# Patient Record
Sex: Male | Born: 1985 | Race: White | Hispanic: No | Marital: Single | Smoking: Never smoker
Health system: Southern US, Community
[De-identification: ages and names within clinical notes are randomized; demographics above are authoritative.]

## PROBLEM LIST (undated history)

## (undated) DIAGNOSIS — J302 Other seasonal allergic rhinitis: Secondary | ICD-10-CM

## (undated) DIAGNOSIS — F909 Attention-deficit hyperactivity disorder, unspecified type: Secondary | ICD-10-CM

## (undated) HISTORY — DX: Attention-deficit hyperactivity disorder, unspecified type: F90.9

## (undated) HISTORY — DX: Other seasonal allergic rhinitis: J30.2

---
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
## 2006-04-17 ENCOUNTER — Emergency Department (HOSPITAL_COMMUNITY): Admission: EM | Admit: 2006-04-17 | Discharge: 2006-04-17 | Payer: Self-pay | Admitting: Family Medicine

## 2013-11-06 ENCOUNTER — Telehealth: Payer: Self-pay

## 2013-11-06 NOTE — Telephone Encounter (Signed)
error 

## 2017-06-29 ENCOUNTER — Ambulatory Visit (INDEPENDENT_AMBULATORY_CARE_PROVIDER_SITE_OTHER): Payer: BLUE CROSS/BLUE SHIELD | Admitting: Adult Health

## 2017-06-29 ENCOUNTER — Encounter: Payer: Self-pay | Admitting: Adult Health

## 2017-06-29 VITALS — BP 118/72 | Temp 98.2°F | Wt 256.0 lb

## 2017-06-29 DIAGNOSIS — Z760 Encounter for issue of repeat prescription: Secondary | ICD-10-CM

## 2017-06-29 DIAGNOSIS — J0140 Acute pansinusitis, unspecified: Secondary | ICD-10-CM | POA: Diagnosis not present

## 2017-06-29 MED ORDER — LEVOCETIRIZINE DIHYDROCHLORIDE 5 MG PO TABS: 5.0000 mg | ORAL_TABLET | Freq: Every evening | ORAL | 1 refills | Status: DC

## 2017-06-29 MED ORDER — DOXYCYCLINE HYCLATE 100 MG PO CAPS: 100.0000 mg | ORAL_CAPSULE | Freq: Two times a day (BID) | ORAL | 0 refills | Status: DC

## 2017-06-29 NOTE — Progress Notes (Signed)
Subjective:    Patient ID: Walter Haney, male    DOB: 1985-03-13, 32 y.o.   MRN: [id number]  Sinusitis  This is a new problem. The current episode started 1 to 4 weeks ago. The problem is unchanged. There has been no fever. Associated symptoms include congestion, coughing (productive ), ear pain, headaches, sinus pressure and a sore throat. Pertinent negatives include no chills, shortness of breath or swollen glands. Treatments tried: Mucinex     Review of Systems  Constitutional: Positive for activity change and fatigue. Negative for appetite change, chills and fever.  HENT: Positive for congestion, ear pain, postnasal drip, rhinorrhea, sinus pressure, sinus pain and sore throat.   Respiratory: Positive for cough (productive ). Negative for chest tightness, shortness of breath and wheezing.   Cardiovascular: Negative.   Gastrointestinal: Negative.   Musculoskeletal: Negative.   Neurological: Positive for headaches.   History reviewed. No pertinent past medical history.  Social History   Socioeconomic History  . Marital status: Single    Spouse name: Not on file  . Number of children: Not on file  . Years of education: Not on file  . Highest education level: Not on file  Occupational History  . Not on file  Social Needs  . Financial resource strain: Not on file  . Food insecurity:    Worry: Not on file    Inability: Not on file  . Transportation needs:    Medical: Not on file    Non-medical: Not on file  Tobacco Use  . Smoking status: Never Smoker  . Smokeless tobacco: Current User    Types: Chew  Substance and Sexual Activity  . Alcohol use: Yes    Alcohol/week: 1.8 oz    Types: 3 Cans of beer per week  . Drug use: Not on file  . Sexual activity: Not on file  Lifestyle  . Physical activity:    Days per week: Not on file    Minutes per session: Not on file  . Stress: Not on file  Relationships  . Social connections:    Talks on phone: Not on file    Gets  together: Not on file    Attends religious service: Not on file    Active member of club or organization: Not on file    Attends meetings of clubs or organizations: Not on file    Relationship status: Not on file  . Intimate partner violence:    Fear of current or ex partner: Not on file    Emotionally abused: Not on file    Physically abused: Not on file    Forced sexual activity: Not on file  Other Topics Concern  . Not on file  Social History Narrative  . Not on file    History reviewed. No pertinent surgical history.  History reviewed. No pertinent family history.  No Known Allergies  Current Outpatient Medications on File Prior to Visit  Medication Sig Dispense Refill  . levocetirizine (XYZAL) 5 MG tablet Take 5 mg by mouth every evening.     No current facility-administered medications on file prior to visit.     BP 118/72 (BP Location: Left Arm)   Temp 98.2 F (36.8 C) (Oral)   Wt 256 lb (116.1 kg)       Objective:   Physical Exam  Constitutional: He appears well-developed and well-nourished. No distress.  HENT:  Head: Normocephalic and atraumatic.  Right Ear: Hearing, external ear and ear canal normal. Tympanic membrane  is bulging. Tympanic membrane is not erythematous.  Left Ear: Hearing, external ear and ear canal normal. Tympanic membrane is bulging. Tympanic membrane is not erythematous.  Nose: Mucosal edema and rhinorrhea present. Right sinus exhibits frontal sinus tenderness. Left sinus exhibits frontal sinus tenderness.  Mouth/Throat: Uvula is midline and mucous membranes are normal. Oropharyngeal exudate and posterior oropharyngeal erythema present.  Eyes: Pupils are equal, round, and reactive to light. Conjunctivae and EOM are normal. Right eye exhibits no discharge. Left eye exhibits no discharge. No scleral icterus.  Cardiovascular: Normal rate, regular rhythm, normal heart sounds and intact distal pulses. Exam reveals no friction rub.  No murmur  heard. Pulmonary/Chest: Effort normal and breath sounds normal. No respiratory distress. He has no wheezes. He has no rales. He exhibits no tenderness.  Skin: Skin is warm and dry. He is not diaphoretic.  Psychiatric: He has a normal mood and affect. His behavior is normal. Judgment and thought content normal.  Nursing note and vitals reviewed.     Assessment & Plan:  1. Acute non-recurrent pansinusitis  - doxycycline (VIBRAMYCIN) 100 MG capsule; Take 1 capsule (100 mg total) by mouth 2 (two) times daily.  Dispense: 14 capsule; Refill: 0 - Follow up as needed   2. Medication refill  - levocetirizine (XYZAL) 5 MG tablet; Take 1 tablet (5 mg total) by mouth every evening.  Dispense: 90 tablet; Refill: 1   Shirline Freesory Leilah Polimeni, NP

## 2017-07-06 ENCOUNTER — Telehealth: Payer: Self-pay | Admitting: Adult Health

## 2017-07-06 NOTE — Telephone Encounter (Signed)
Spoke to the pt.  He did pick up doxy that was sent to the pharmacy on 06/29/17.  One more dosage to take.  Does not seem to be helping much.  See below message and advise.  Pt notified Kandee Keen is not back in the office until 07/07/17.  Instructed to continue current treatment course until he hears from the office.

## 2017-07-06 NOTE — Telephone Encounter (Signed)
Copied from CRM [id number]. Topic: Inquiry >> Jul 06, 2017  3:29 PM Maia Petties wrote: Reason for CRM: pt still has same symptoms as last week but his throat feels even worse. His ears are stopped up. His head is pounding. He is having a lot of drainage. Pt states some fever/chills still as well. Pt requesting a medication be sent in. Please advise.  [id number] [place], [place] - [phone] (Phone) [phone] (Fax)

## 2017-07-07 ENCOUNTER — Ambulatory Visit: Payer: BLUE CROSS/BLUE SHIELD | Admitting: Adult Health

## 2017-07-07 NOTE — Telephone Encounter (Signed)
Left a message for a return call.

## 2017-07-07 NOTE — Telephone Encounter (Signed)
Pt scheduled for 07/07/17 to see [facility].  No further action required.

## 2017-07-07 NOTE — Telephone Encounter (Signed)
If his symptoms are getting worse he needs to be reevaluated, especially if he has a worsening sore throat. Needs to probably be swabbed for strep

## 2017-07-07 NOTE — Telephone Encounter (Signed)
Pt called back and advised per Kandee Keen needs to be re evaluated.  Pt verbalized understanding and made appt for today at 4 pm.

## 2017-07-08 ENCOUNTER — Encounter: Payer: Self-pay | Admitting: Adult Health

## 2017-07-08 ENCOUNTER — Ambulatory Visit (INDEPENDENT_AMBULATORY_CARE_PROVIDER_SITE_OTHER): Payer: BLUE CROSS/BLUE SHIELD | Admitting: Adult Health

## 2017-07-08 VITALS — BP 120/78 | HR 66 | Temp 98.3°F | Wt 252.0 lb

## 2017-07-08 DIAGNOSIS — J0140 Acute pansinusitis, unspecified: Secondary | ICD-10-CM

## 2017-07-08 MED ORDER — FLUTICASONE PROPIONATE 50 MCG/ACT NA SUSP: 2.0000 | Freq: Every day | NASAL | 6 refills | Status: DC

## 2017-07-08 MED ORDER — LEVOFLOXACIN 500 MG PO TABS: 500.0000 mg | ORAL_TABLET | Freq: Every day | ORAL | 0 refills | Status: DC

## 2017-07-08 NOTE — Progress Notes (Signed)
Subjective:    Patient ID: Walter Haney, male    DOB: 24-May-1985, 32 y.o.   MRN: [id number]  HPI 32 year old male who presents to the office today for follow up regarding sinusitis.  He was seen approximately 10 days ago for sinusitis-like symptoms.  He was treated with doxycycline 100 mg twice daily for 7 days.  Reports that some of his symptoms such as congestion and backed up cough have resolved.  Unfortunately, sinus pain and pressure, right ear pain, and sore throat have continued and have become slightly worse.  He also reports subjective fever, chills, diaphoresis.  So endorsing fatigue.  Denies any nausea, vomiting, diarrhea   Review of Systems See HPI   History reviewed. No pertinent past medical history.  Social History   Socioeconomic History  . Marital status: Single    Spouse name: Not on file  . Number of children: Not on file  . Years of education: Not on file  . Highest education level: Not on file  Occupational History  . Not on file  Social Needs  . Financial resource strain: Not on file  . Food insecurity:    Worry: Not on file    Inability: Not on file  . Transportation needs:    Medical: Not on file    Non-medical: Not on file  Tobacco Use  . Smoking status: Never Smoker  . Smokeless tobacco: Current User    Types: Chew  Substance and Sexual Activity  . Alcohol use: Yes    Alcohol/week: 1.8 oz    Types: 3 Cans of beer per week  . Drug use: Not on file  . Sexual activity: Not on file  Lifestyle  . Physical activity:    Days per week: Not on file    Minutes per session: Not on file  . Stress: Not on file  Relationships  . Social connections:    Talks on phone: Not on file    Gets together: Not on file    Attends religious service: Not on file    Active member of club or organization: Not on file    Attends meetings of clubs or organizations: Not on file    Relationship status: Not on file  . Intimate partner violence:    Fear of  current or ex partner: Not on file    Emotionally abused: Not on file    Physically abused: Not on file    Forced sexual activity: Not on file  Other Topics Concern  . Not on file  Social History Narrative  . Not on file    History reviewed. No pertinent surgical history.  History reviewed. No pertinent family history.  No Known Allergies  Current Outpatient Medications on File Prior to Visit  Medication Sig Dispense Refill  . levocetirizine (XYZAL) 5 MG tablet Take 5 mg by mouth every evening.    Marland Kitchen levocetirizine (XYZAL) 5 MG tablet Take 1 tablet (5 mg total) by mouth every evening. 90 tablet 1   No current facility-administered medications on file prior to visit.     BP 120/78 (BP Location: Left Arm, Patient Position: Sitting, Cuff Size: Large)   Pulse 66   Temp 98.3 F (36.8 C) (Oral)   Wt 252 lb (114.3 kg)   SpO2 98%       Objective:   Physical Exam  Constitutional: He is oriented to person, place, and time. He appears well-developed and well-nourished. No distress.  HENT:  Right Ear:  Tympanic membrane is bulging. Tympanic membrane is not erythematous.  Left Ear: Tympanic membrane is bulging. Tympanic membrane is not erythematous.  Nose: Mucosal edema present. No rhinorrhea. Right sinus exhibits maxillary sinus tenderness and frontal sinus tenderness. Left sinus exhibits maxillary sinus tenderness and frontal sinus tenderness.  Mouth/Throat: Uvula is midline and mucous membranes are normal. Oropharyngeal exudate and posterior oropharyngeal erythema (trace) present. No posterior oropharyngeal edema or tonsillar abscesses. Tonsils are 0 on the right. Tonsils are 0 on the left. No tonsillar exudate.  Neck: No thyromegaly present.  Cardiovascular: Normal rate, regular rhythm, normal heart sounds and intact distal pulses. Exam reveals no friction rub.  No murmur heard. Pulmonary/Chest: Effort normal and breath sounds normal. No stridor. No respiratory distress. He has no  wheezes. He has no rales. He exhibits no tenderness.  Lymphadenopathy:       Head (right side): Submandibular and tonsillar adenopathy present. No submental, no preauricular, no posterior auricular and no occipital adenopathy present.       Head (left side): Submandibular and tonsillar adenopathy present. No submental, no preauricular, no posterior auricular and no occipital adenopathy present.    He has no cervical adenopathy.  Neurological: He is alert and oriented to person, place, and time.  Skin: Skin is warm and dry. Capillary refill takes less than 2 seconds. No rash noted. He is not diaphoretic. No erythema. No pallor.  Psychiatric: He has a normal mood and affect. His behavior is normal. Judgment and thought content normal.  Nursing note and vitals reviewed.     Assessment & Plan:  1. Acute pansinusitis, recurrence not specified -We will treat as antibiotic failure.  Prescribe Levaquin 500 mg daily for 7 days.  Can also use Flonase.  Advised Motrin or Tylenol for symptom relief.  Stay hydrated and rest - levofloxacin (LEVAQUIN) 500 MG tablet; Take 1 tablet (500 mg total) by mouth daily.  Dispense: 7 tablet; Refill: 0 - fluticasone (FLONASE) 50 MCG/ACT nasal spray; Place 2 sprays into both nostrils daily.  Dispense: 16 g; Refill: 6 -Follow-up if no improvement after antibiotic course  Shirline Frees, NP

## 2017-08-09 ENCOUNTER — Encounter: Payer: Self-pay | Admitting: Adult Health

## 2017-08-09 ENCOUNTER — Ambulatory Visit (INDEPENDENT_AMBULATORY_CARE_PROVIDER_SITE_OTHER): Payer: BLUE CROSS/BLUE SHIELD | Admitting: Adult Health

## 2017-08-09 VITALS — BP 98/70 | Temp 97.7°F | Wt 253.0 lb

## 2017-08-09 DIAGNOSIS — Z720 Tobacco use: Secondary | ICD-10-CM | POA: Diagnosis not present

## 2017-08-09 DIAGNOSIS — Z0000 Encounter for general adult medical examination without abnormal findings: Secondary | ICD-10-CM | POA: Diagnosis not present

## 2017-08-09 LAB — CBC WITH DIFFERENTIAL/PLATELET
Basophils Absolute: 0 10*3/uL (ref 0.0–0.1)
Basophils Relative: 0.7 % (ref 0.0–3.0)
Eosinophils Absolute: 0.1 10*3/uL (ref 0.0–0.7)
Eosinophils Relative: 1.1 % (ref 0.0–5.0)
HCT: 44 % (ref 39.0–52.0)
Hemoglobin: 15.3 g/dL (ref 13.0–17.0)
Lymphocytes Relative: 30.5 % (ref 12.0–46.0)
Lymphs Abs: 1.4 10*3/uL (ref 0.7–4.0)
MCHC: 34.9 g/dL (ref 30.0–36.0)
MCV: 95.8 fl (ref 78.0–100.0)
Monocytes Absolute: 0.4 10*3/uL (ref 0.1–1.0)
Monocytes Relative: 8.1 % (ref 3.0–12.0)
Neutro Abs: 2.8 10*3/uL (ref 1.4–7.7)
Neutrophils Relative %: 59.6 % (ref 43.0–77.0)
Platelets: 233 10*3/uL (ref 150.0–400.0)
RBC: 4.59 Mil/uL (ref 4.22–5.81)
RDW: 12.7 % (ref 11.5–15.5)
WBC: 4.7 10*3/uL (ref 4.0–10.5)

## 2017-08-09 LAB — LIPID PANEL
Cholesterol: 176 mg/dL (ref 0–200)
HDL: 44.6 mg/dL (ref >39.00)
NonHDL: 131.84
Total CHOL/HDL Ratio: 4
Triglycerides: 206 mg/dL — ABNORMAL HIGH (ref 0.0–149.0)
VLDL: 41.2 mg/dL — ABNORMAL HIGH (ref 0.0–40.0)

## 2017-08-09 LAB — HEMOGLOBIN A1C: Hgb A1c MFr Bld: 5.3 % (ref 4.6–6.5)

## 2017-08-09 LAB — BASIC METABOLIC PANEL
BUN: 11 mg/dL (ref 6–23)
CO2: 28 mEq/L (ref 19–32)
Calcium: 9.2 mg/dL (ref 8.4–10.5)
Chloride: 104 mEq/L (ref 96–112)
Creatinine, Ser: 0.91 mg/dL (ref 0.40–1.50)
GFR: 102.79 mL/min (ref >60.00)
Glucose, Bld: 97 mg/dL (ref 70–99)
Potassium: 4.3 mEq/L (ref 3.5–5.1)
Sodium: 140 mEq/L (ref 135–145)

## 2017-08-09 LAB — HEPATIC FUNCTION PANEL
ALT: 24 U/L (ref 0–53)
AST: 16 U/L (ref 0–37)
Albumin: 4.5 g/dL (ref 3.5–5.2)
Alkaline Phosphatase: 47 U/L (ref 39–117)
Bilirubin, Direct: 0.1 mg/dL (ref 0.0–0.3)
Total Bilirubin: 0.4 mg/dL (ref 0.2–1.2)
Total Protein: 6.7 g/dL (ref 6.0–8.3)

## 2017-08-09 LAB — TSH: TSH: 2.12 u[IU]/mL (ref 0.35–4.50)

## 2017-08-09 LAB — LDL CHOLESTEROL, DIRECT: Direct LDL: 102 mg/dL

## 2017-08-09 NOTE — Progress Notes (Signed)
Patient presents to clinic today to establish care. He is a pleasant 32 year old male who  has a past medical history of Seasonal allergies.   Acute Concerns: Establish Care/CPE   Chronic Issues: Seasonal Allergies - controlled with Flonase and Zyzal   Smokeless tobacco use - Has been an issue for sometime now. He has quit multiple times in the past. Currently 1/2 tin in 24 hours. Has been working on quitting. Has tried coffee ground patches and reports good success with this.   Health Maintenance: Dental -- Routine - [facility]  Vision -- Routine - [facility] Immunizations -- UTD - Tetanus 3 years ago.  Diet: Tries to eat healthy. Diet will fluctuate. He travels a lot for work.  Exercise: works out 3-4 times a week. He finds it difficult to loose weight.   Past Medical History:  Diagnosis Date  . Seasonal allergies     No past surgical history on file.  Current Outpatient Medications on File Prior to Visit  Medication Sig Dispense Refill  . fluticasone (FLONASE) 50 MCG/ACT nasal spray Place 2 sprays into both nostrils daily. 16 g 6  . levocetirizine (XYZAL) 5 MG tablet Take 5 mg by mouth every evening.     No current facility-administered medications on file prior to visit.     No Known Allergies  Family History  Problem Relation Age of Onset  . High blood pressure Father   . High Cholesterol Father   . Benign prostatic hyperplasia Father   . Glaucoma Father     Social History   Socioeconomic History  . Marital status: Single    Spouse name: Not on file  . Number of children: Not on file  . Years of education: Not on file  . Highest education level: Not on file  Occupational History  . Not on file  Social Needs  . Financial resource strain: Not on file  . Food insecurity:    Worry: Not on file    Inability: Not on file  . Transportation needs:    Medical: Not on file    Non-medical: Not on file  Tobacco Use  . Smoking status: Never Smoker  .  Smokeless tobacco: Current User    Types: Chew  Substance and Sexual Activity  . Alcohol use: Yes    Alcohol/week: 1.8 oz    Types: 3 Cans of beer per week  . Drug use: Not on file  . Sexual activity: Yes    Partners: Female  Lifestyle  . Physical activity:    Days per week: Not on file    Minutes per session: Not on file  . Stress: Not on file  Relationships  . Social connections:    Talks on phone: Not on file    Gets together: Not on file    Attends religious service: Not on file    Active member of club or organization: Not on file    Attends meetings of clubs or organizations: Not on file    Relationship status: Not on file  . Intimate partner violence:    Fear of current or ex partner: Not on file    Emotionally abused: Not on file    Physically abused: Not on file    Forced sexual activity: Not on file  Other Topics Concern  . Not on file  Social History Narrative  . Not on file    Review of Systems  Constitutional: Negative.   Eyes: Negative.  Respiratory: Negative.   Cardiovascular: Negative.   Gastrointestinal: Negative.   Genitourinary: Negative.   Musculoskeletal: Negative.   Skin: Negative.   Neurological: Negative.   Endo/Heme/Allergies: Negative.   Psychiatric/Behavioral: Negative.   All other systems reviewed and are negative.    There were no vitals taken for this visit.  Physical Exam  Constitutional: He is oriented to person, place, and time. He appears well-developed and well-nourished. No distress.  HENT:  Head: Normocephalic and atraumatic.  Right Ear: External ear normal.  Left Ear: External ear normal.  Nose: Nose normal.  Mouth/Throat: Oropharynx is clear and moist. No oropharyngeal exudate.  Eyes: Pupils are equal, round, and reactive to light. Conjunctivae and EOM are normal. Right eye exhibits no discharge. Left eye exhibits no discharge. No scleral icterus.  Neck: Normal range of motion. Neck supple. No JVD present. No tracheal  deviation present. No thyromegaly present.  Cardiovascular: Normal rate, regular rhythm, normal heart sounds and intact distal pulses. Exam reveals no gallop and no friction rub.  No murmur heard. Pulmonary/Chest: Effort normal and breath sounds normal. No stridor. No respiratory distress. He has no wheezes. He has no rales. He exhibits no tenderness.  Abdominal: Soft. Bowel sounds are normal. He exhibits no distension and no mass. There is no tenderness. There is no rebound and no guarding. No hernia.  Musculoskeletal: Normal range of motion. He exhibits no edema, tenderness or deformity.  Lymphadenopathy:    He has no cervical adenopathy.  Neurological: He is alert and oriented to person, place, and time. He displays normal reflexes. No cranial nerve deficit or sensory deficit. He exhibits normal muscle tone. Coordination normal.  Skin: Skin is warm and dry. Capillary refill takes less than 2 seconds. No rash noted. He is not diaphoretic. No erythema. No pallor.  Psychiatric: He has a normal mood and affect. His behavior is normal. Judgment and thought content normal.  Nursing note and vitals reviewed.  Assessment/Plan: 1. Routine general medical examination at a health care facility - Follow up in one year or sooner if needed - Continue to work on diet and exercise  - Basic metabolic panel - CBC with Differential/Platelet - Hemoglobin A1c - Hepatic function panel - Lipid panel - TSH  2. Tobacco use - Encouraged to quit.  - Can go back to non tobacco pouches  Shirline Frees, NP

## 2017-08-09 NOTE — Patient Instructions (Signed)
I will follow up with you regarding your blood work   Please let me know if you need anything   Please quit dipping.

## 2017-09-30 ENCOUNTER — Telehealth: Payer: Self-pay | Admitting: Adult Health

## 2017-09-30 NOTE — Telephone Encounter (Unsigned)
Copied from CRM [id number]. Topic: Quick Communication - See Telephone Encounter >> Sep 30, 2017 12:45 PM Raquel SarnaHayes, Teresa G wrote: Pt has been off of Adderral for 4 months, the time since he as moved to this area. He is wanting to get back on it to help since is trouble focusing and forgetting things. Please call pt to discuss.  Pt is willing to come in for a visit if needed.

## 2017-10-04 NOTE — Telephone Encounter (Signed)
Would you like to see the pt or do a referral to specialist?

## 2017-10-04 NOTE — Telephone Encounter (Signed)
Since it is a controlled substance I will need an office visit to discuss. I have no problems taking this over for him

## 2017-10-04 NOTE — Telephone Encounter (Signed)
Patient will check his schedule and call back to set up an appointment

## 2017-10-04 NOTE — Telephone Encounter (Addendum)
Left a message for a return call.  CRM created. 

## 2017-10-07 NOTE — Telephone Encounter (Signed)
°  Relation to pt: self  Call back number: [phone]   Reason for call:  [facility] call [facility]

## 2017-10-11 ENCOUNTER — Ambulatory Visit (INDEPENDENT_AMBULATORY_CARE_PROVIDER_SITE_OTHER): Payer: BLUE CROSS/BLUE SHIELD | Admitting: Adult Health

## 2017-10-11 ENCOUNTER — Encounter: Payer: Self-pay | Admitting: Adult Health

## 2017-10-11 VITALS — BP 114/72 | Temp 98.1°F | Wt 251.0 lb

## 2017-10-11 DIAGNOSIS — F900 Attention-deficit hyperactivity disorder, predominantly inattentive type: Secondary | ICD-10-CM | POA: Diagnosis not present

## 2017-10-11 MED ORDER — AMPHETAMINE-DEXTROAMPHETAMINE 20 MG PO TABS: 20.0000 mg | ORAL_TABLET | Freq: Two times a day (BID) | ORAL | 0 refills | Status: DC

## 2017-10-11 NOTE — Progress Notes (Signed)
Subjective:    Patient ID: Walter Haney, male    DOB: April 18, 1985, 32 y.o.   MRN: [id number]  HPI 32 year old male who  has a past medical history of Seasonal allergies. He presents to the office to discuss Adderall management. His previous PCP was prescribing his medication, he has been without it for four months. He reports that he has trouble focusing and is having trouble forgetting things. He feels as though his symptoms are causing him issues at work and at home.     Review of Systems See HPI   Past Medical History:  Diagnosis Date  . Seasonal allergies     Social History   Socioeconomic History  . Marital status: Single    Spouse name: Not on file  . Number of children: Not on file  . Years of education: Not on file  . Highest education level: Not on file  Occupational History  . Not on file  Social Needs  . Financial resource strain: Not on file  . Food insecurity:    Worry: Not on file    Inability: Not on file  . Transportation needs:    Medical: Not on file    Non-medical: Not on file  Tobacco Use  . Smoking status: Never Smoker  . Smokeless tobacco: Current User    Types: Chew  Substance and Sexual Activity  . Alcohol use: Yes    Alcohol/week: 1.8 oz    Types: 3 Cans of beer per week  . Drug use: Not on file  . Sexual activity: Yes    Partners: Female  Lifestyle  . Physical activity:    Days per week: Not on file    Minutes per session: Not on file  . Stress: Not on file  Relationships  . Social connections:    Talks on phone: Not on file    Gets together: Not on file    Attends religious service: Not on file    Active member of club or organization: Not on file    Attends meetings of clubs or organizations: Not on file    Relationship status: Not on file  . Intimate partner violence:    Fear of current or ex partner: Not on file    Emotionally abused: Not on file    Physically abused: Not on file    Forced sexual activity: Not on file   Other Topics Concern  . Not on file  Social History Narrative  . Not on file    History reviewed. No pertinent surgical history.  Family History  Problem Relation Age of Onset  . High blood pressure Father   . High Cholesterol Father   . Benign prostatic hyperplasia Father   . Glaucoma Father     No Known Allergies  Current Outpatient Medications on File Prior to Visit  Medication Sig Dispense Refill  . cetirizine (ZYRTEC) 10 MG tablet Take 10 mg by mouth daily.    . fluticasone (FLONASE) 50 MCG/ACT nasal spray Place 2 sprays into both nostrils daily. 16 g 6   No current facility-administered medications on file prior to visit.     BP 114/72   Temp 98.1 F (36.7 C) (Oral)   Wt 251 lb (113.9 kg)       Objective:   Physical Exam  Constitutional: He is oriented to person, place, and time. He appears well-developed and well-nourished. No distress.  Cardiovascular: Normal rate and regular rhythm.  Pulmonary/Chest: Effort normal and  breath sounds normal.  Musculoskeletal: Normal range of motion.  Neurological: He is alert and oriented to person, place, and time.  Skin: Skin is warm and dry. He is not diaphoretic.  Psychiatric: He has a normal mood and affect. His behavior is normal. Judgment and thought content normal.  Vitals reviewed.     Assessment & Plan:  1. Attention deficit hyperactivity disorder (ADHD), predominantly inattentive type - I am ok with restarting/ prescribing him Adderall. Advised follow up in 6 months or sooner if needed - amphetamine-dextroamphetamine (ADDERALL) 20 MG tablet; Take 1 tablet (20 mg total) by mouth 2 (two) times daily.  Dispense: 60 tablet; Refill: 0 - amphetamine-dextroamphetamine (ADDERALL) 20 MG tablet; Take 1 tablet (20 mg total) by mouth 2 (two) times daily.  Dispense: 60 tablet; Refill: 0 - amphetamine-dextroamphetamine (ADDERALL) 20 MG tablet; Take 1 tablet (20 mg total) by mouth 2 (two) times daily.  Dispense: 60 tablet;  Refill: 0   Shirline Freesory Cashay Manganelli, NP

## 2017-11-15 ENCOUNTER — Other Ambulatory Visit: Payer: Self-pay | Admitting: Adult Health

## 2017-11-15 ENCOUNTER — Telehealth: Payer: Self-pay | Admitting: Adult Health

## 2017-11-15 DIAGNOSIS — F900 Attention-deficit hyperactivity disorder, predominantly inattentive type: Secondary | ICD-10-CM

## 2017-11-15 MED ORDER — AMPHETAMINE-DEXTROAMPHETAMINE 20 MG PO TABS: 20.0000 mg | ORAL_TABLET | Freq: Two times a day (BID) | ORAL | 0 refills | Status: DC

## 2017-11-15 NOTE — Telephone Encounter (Signed)
Copied from CRM [id number]. Topic: Quick Communication - Rx Refill/Question >> Nov 15, 2017  3:49 PM Maia Petties wrote: Medication: adderall - pts regular pharmacy in [place] [facility] is out of stock on adderall - pt is out of medication - [facility] [place] [facility] Has the patient contacted their pharmacy? Yes [facility] pharmacy advised pt that Silvestre Gunner has in Forensic psychologist (with phone number or street name): [id number] [place], [place] - [phone] (Phone) [phone] (Fax)

## 2017-12-20 ENCOUNTER — Other Ambulatory Visit: Payer: Self-pay | Admitting: Adult Health

## 2017-12-20 ENCOUNTER — Telehealth: Payer: Self-pay | Admitting: Adult Health

## 2017-12-20 DIAGNOSIS — F900 Attention-deficit hyperactivity disorder, predominantly inattentive type: Secondary | ICD-10-CM

## 2017-12-20 MED ORDER — AMPHETAMINE-DEXTROAMPHETAMINE 20 MG PO TABS: 20.0000 mg | ORAL_TABLET | Freq: Two times a day (BID) | ORAL | 0 refills | Status: DC

## 2017-12-20 NOTE — Telephone Encounter (Signed)
Copied from CRM [id number]. Topic: Quick Communication - Rx Refill/Question >> Dec 20, 2017 10:43 AM Darletta Moll L wrote: Medication: amphetamine-dextroamphetamine (ADDERALL) 20 MG tablet   Has the patient contacted their pharmacy? Yes.   (Agent: If no, request that the patient contact the pharmacy for the refill.) (Agent: If yes, when and what did the pharmacy advise?)  Preferred Pharmacy (with phone number or street name): [id number] [place], [place] - [address] [place] [address] Phone: [phone] Fax: [phone]  Agent: Please be advised that RX refills may take up to 3 business days. We ask that you follow-up with your pharmacy.

## 2017-12-20 NOTE — Telephone Encounter (Signed)
Last refill 10/11/17 and last office visit 11/15/17. Okay to fill?

## 2018-01-09 ENCOUNTER — Ambulatory Visit (HOSPITAL_COMMUNITY)
Admission: EM | Admit: 2018-01-09 | Discharge: 2018-01-09 | Disposition: A | Discharge status: Home / Self Care | Payer: Worker's Compensation | Attending: Emergency Medicine | Admitting: Emergency Medicine

## 2018-01-09 ENCOUNTER — Ambulatory Visit (INDEPENDENT_AMBULATORY_CARE_PROVIDER_SITE_OTHER): Payer: Worker's Compensation

## 2018-01-09 ENCOUNTER — Ambulatory Visit (HOSPITAL_COMMUNITY): Payer: Worker's Compensation

## 2018-01-09 ENCOUNTER — Other Ambulatory Visit: Payer: Self-pay

## 2018-01-09 ENCOUNTER — Encounter (HOSPITAL_COMMUNITY): Payer: Self-pay | Admitting: *Deleted

## 2018-01-09 DIAGNOSIS — M5441 Lumbago with sciatica, right side: Secondary | ICD-10-CM

## 2018-01-09 DIAGNOSIS — M25551 Pain in right hip: Secondary | ICD-10-CM

## 2018-01-09 DIAGNOSIS — W19XXXA Unspecified fall, initial encounter: Secondary | ICD-10-CM

## 2018-01-09 MED ORDER — CYCLOBENZAPRINE HCL 5 MG PO TABS: 5.0000 mg | ORAL_TABLET | Freq: Every day | ORAL | 0 refills | Status: DC

## 2018-01-09 MED ORDER — PREDNISONE 10 MG (21) PO TBPK: ORAL_TABLET | Freq: Every day | ORAL | 0 refills | Status: DC

## 2018-01-09 NOTE — ED Triage Notes (Signed)
States he fell at work 2 weeks ago and injuried his right hip, states its not getting better.

## 2018-01-09 NOTE — ED Provider Notes (Signed)
[facility]    CSN: [id number] Arrival date & time: 01/09/18  1603     History   Chief Complaint Chief Complaint  Patient presents with  . Fall    HPI Walter Haney is a 32 y.o. male.   [name] presents with complaints of right hip pain s/p fall two weeks ago. States was at work, had turned around and walking down a hallway, slipped and fell straight back onto buttocks and also catching self with left hand. Had immediate left hand pain which has improved. He went to his car after incident. Felt that he had numbness to left leg. No back pain at the time. No right leg pain. This has progressed to his right hip. Feels his right low back and groin are painful. Worse with activity. Had to sit down while at [facility] the other day even due to pain. Feels intermittent numbness to right leg. No knee pain. Pain 6/10. Has been taking ibuprofen regularly which helps some with pain. Denies any previous back or hip injury. Without contributing medical history.     ROS per HPI.      Past Medical History:  Diagnosis Date  . Seasonal allergies     There are no active problems to display for this patient.   History reviewed. No pertinent surgical history.     Home Medications    Prior to Admission medications   Medication Sig Start Date End Date Taking? Authorizing Provider  amphetamine-dextroamphetamine (ADDERALL) 20 MG tablet Take 1 tablet (20 mg total) by mouth 2 (two) times daily. 12/20/17   Nafziger, Kandee Keen, NP  amphetamine-dextroamphetamine (ADDERALL) 20 MG tablet Take 1 tablet (20 mg total) by mouth 2 (two) times daily. 12/20/17   Nafziger, Kandee Keen, NP  amphetamine-dextroamphetamine (ADDERALL) 20 MG tablet Take 1 tablet (20 mg total) by mouth 2 (two) times daily. 12/20/17   Nafziger, Kandee Keen, NP  cetirizine (ZYRTEC) 10 MG tablet Take 10 mg by mouth daily.    [provider]  cyclobenzaprine (FLEXERIL) 5 MG tablet Take 1 tablet (5 mg total) by mouth at bedtime.  01/09/18   Georgetta Haber, NP  fluticasone (FLONASE) 50 MCG/ACT nasal spray Place 2 sprays into both nostrils daily. 07/08/17   Nafziger, Kandee Keen, NP  predniSONE (STERAPRED UNI-PAK 21 TAB) 10 MG (21) TBPK tablet Take by mouth daily. Per box instruct 01/09/18   Georgetta Haber, NP    Family History Family History  Problem Relation Age of Onset  . High blood pressure Father   . High Cholesterol Father   . Benign prostatic hyperplasia Father   . Glaucoma Father     Social History Social History   Tobacco Use  . Smoking status: Never Smoker  . Smokeless tobacco: Current User    Types: Chew  Substance Use Topics  . Alcohol use: Yes    Alcohol/week: 3.0 standard drinks    Types: 3 Cans of beer per week  . Drug use: Not on file     Allergies   Patient has no known allergies.   Review of Systems Review of Systems   Physical Exam Triage Vital Signs ED Triage Vitals  Enc Vitals Group     BP 01/09/18 1626 (!) 144/93     Pulse Rate 01/09/18 1626 63     Resp 01/09/18 1626 16     Temp 01/09/18 1626 97.6 F (36.4 C)     Temp Source 01/09/18 1626 Oral     SpO2 01/09/18 1626 100 %  Weight --      Height --      Head Circumference --      Peak Flow --      Pain Score 01/09/18 1628 7     Pain Loc --      Pain Edu? --      Excl. in GC? --    No data found.  Updated Vital Signs BP (!) 144/93 (BP Location: Right Arm)   Pulse 63   Temp 97.6 F (36.4 C) (Oral)   Resp 16   SpO2 100%    Physical Exam  Constitutional: He is oriented to person, place, and time. He appears well-developed and well-nourished.  Cardiovascular: Normal rate and regular rhythm.  Pulmonary/Chest: Effort normal and breath sounds normal.  Musculoskeletal:       Right hip: He exhibits tenderness and bony tenderness. He exhibits normal range of motion, normal strength, no swelling, no crepitus, no deformity and no laceration.       Lumbar back: He exhibits tenderness and pain. He exhibits normal  range of motion, no bony tenderness, no swelling, no edema, no deformity, no laceration, no spasm and normal pulse.       Back:       Legs: No spinous process tenderness to back, no step off or deformity, very specific point tenderness to right low back; right lateral proximal humerus with point tenderness and radiation to groin; no pain with external hip rotation, no pain with log roll; no pain with straight leg raise; strength equal bilaterally; gross sensation intact to lower extremities; ambulatory without difficulty   Neurological: He is alert and oriented to person, place, and time.  Skin: Skin is warm and dry.     UC Treatments / Results  Labs (all labs ordered are listed, but only abnormal results are displayed) Labs Reviewed - No data to display  EKG None  Radiology Dg Hip Unilat With Pelvis 2-3 Views Right  Result Date: 01/09/2018 CLINICAL DATA:  32 year old who fell and injured the RIGHT hip. LATERAL hip pain radiating to the groin. Initial encounter. EXAM: DG HIP (WITH OR WITHOUT PELVIS) 2-3V RIGHT COMPARISON:  None. FINDINGS: No evidence of acute fracture or dislocation. Well preserved joint space. Well preserved bone mineral density. No intrinsic osseous abnormality. Included AP pelvis demonstrates a normal-appearing contralateral LEFT hip. Sacroiliac joints and symphysis pubis intact. Visualized lower lumbar spine unremarkable. IMPRESSION: Normal examination. Electronically Signed   By: Hulan Saas M.D.   On: 01/09/2018 17:18    Procedures Procedures (including critical care time)  Medications Ordered in UC Medications - No data to display  Initial Impression / Assessment and Plan / UC Course  I have reviewed the triage vital signs and the nursing notes.  Pertinent labs & imaging results that were available during my care of the patient were reviewed by me and considered in my medical decision making (see chart for details).     Hip and pelvis imaging normal  tonight s/p fall. Strain vs sciatica discussed. Prednisone pack, flexeril at night. Encouraged close follow up with PCP and/or ortho as needed for persistent symptoms. Patient verbalized understanding and agreeable to plan.  Ambulatory out of clinic without difficulty.    Final Clinical Impressions(s) / UC Diagnoses   Final diagnoses:  Fall  Pain of right hip joint  Acute right-sided low back pain with right-sided sciatica     Discharge Instructions     Xray is normal today which is reassuring.  Bruising and strain  related to fall likely.  Course of steroid to help with swelling and radiation of pain.  Light and regular activity as tolerated.  Muscle relaxer at night. May cause drowsiness. Please do not take if driving or drinking alcohol.   Please follow up with sports medicine and/or your primary care provider if symptoms persist as may need further evaluation and treatment of your symptoms.     ED Prescriptions    Medication Sig Dispense Auth. Provider   predniSONE (STERAPRED UNI-PAK 21 TAB) 10 MG (21) TBPK tablet Take by mouth daily. Per box instruct 21 tablet Burky, Natalie B, NP   cyclobenzaprine (FLEXERIL) 5 MG tablet Take 1 tablet (5 mg total) by mouth at bedtime. 15 tablet Georgetta Haber, NP     Controlled Substance Prescriptions [place] Controlled Substance Registry consulted? Not Applicable   Georgetta Haber, NP 01/09/18 [phone]

## 2018-01-09 NOTE — Discharge Instructions (Signed)
Xray is normal today which is reassuring.  Bruising and strain related to fall likely.  Course of steroid to help with swelling and radiation of pain.  Light and regular activity as tolerated.  Muscle relaxer at night. May cause drowsiness. Please do not take if driving or drinking alcohol.   Please follow up with sports medicine and/or your primary care provider if symptoms persist as may need further evaluation and treatment of your symptoms.

## 2018-05-18 ENCOUNTER — Encounter: Payer: Self-pay | Admitting: Adult Health

## 2018-05-18 ENCOUNTER — Ambulatory Visit (INDEPENDENT_AMBULATORY_CARE_PROVIDER_SITE_OTHER): Payer: BLUE CROSS/BLUE SHIELD | Admitting: Adult Health

## 2018-05-18 ENCOUNTER — Ambulatory Visit (INDEPENDENT_AMBULATORY_CARE_PROVIDER_SITE_OTHER): Payer: BLUE CROSS/BLUE SHIELD

## 2018-05-18 ENCOUNTER — Other Ambulatory Visit: Payer: Self-pay

## 2018-05-18 VITALS — BP 118/72 | Temp 97.8°F | Wt 247.0 lb

## 2018-05-18 DIAGNOSIS — F909 Attention-deficit hyperactivity disorder, unspecified type: Secondary | ICD-10-CM | POA: Insufficient documentation

## 2018-05-18 DIAGNOSIS — Z01818 Encounter for other preprocedural examination: Secondary | ICD-10-CM

## 2018-05-18 DIAGNOSIS — F900 Attention-deficit hyperactivity disorder, predominantly inattentive type: Secondary | ICD-10-CM | POA: Diagnosis not present

## 2018-05-18 LAB — COMPREHENSIVE METABOLIC PANEL
ALT: 22 U/L (ref 0–53)
AST: 16 U/L (ref 0–37)
Albumin: 4.7 g/dL (ref 3.5–5.2)
Alkaline Phosphatase: 47 U/L (ref 39–117)
BUN: 16 mg/dL (ref 6–23)
CO2: 26 mEq/L (ref 19–32)
Calcium: 9.2 mg/dL (ref 8.4–10.5)
Chloride: 104 mEq/L (ref 96–112)
Creatinine, Ser: 0.93 mg/dL (ref 0.40–1.50)
GFR: 93.85 mL/min (ref >60.00)
Glucose, Bld: 70 mg/dL (ref 70–99)
Potassium: 4.1 mEq/L (ref 3.5–5.1)
Sodium: 139 mEq/L (ref 135–145)
Total Bilirubin: 0.3 mg/dL (ref 0.2–1.2)
Total Protein: 6.9 g/dL (ref 6.0–8.3)

## 2018-05-18 LAB — BASIC METABOLIC PANEL
BUN: 16 mg/dL (ref 6–23)
CO2: 26 mEq/L (ref 19–32)
Calcium: 9.2 mg/dL (ref 8.4–10.5)
Chloride: 104 mEq/L (ref 96–112)
Creatinine, Ser: 0.93 mg/dL (ref 0.40–1.50)
GFR: 93.85 mL/min (ref >60.00)
Glucose, Bld: 70 mg/dL (ref 70–99)
Potassium: 4.1 mEq/L (ref 3.5–5.1)
Sodium: 139 mEq/L (ref 135–145)

## 2018-05-18 LAB — APTT: aPTT: 32.9 s — ABNORMAL HIGH (ref 23.4–32.7)

## 2018-05-18 MED ORDER — AMPHETAMINE-DEXTROAMPHETAMINE 20 MG PO TABS: 20.0000 mg | ORAL_TABLET | Freq: Two times a day (BID) | ORAL | 0 refills | Status: DC

## 2018-05-18 NOTE — Progress Notes (Signed)
Subjective:    Patient ID: Walter Haney, male    DOB: 09-19-1985, 33 y.o.   MRN: [id number]  HPI  33 year old male who  has a past medical history of ADHD and Seasonal allergies.   Presents to the office today for preoperative clearance.  He sustained a fall in October that caused a labrum tear in his right hip.  He will be having orthoscopic surgery in April to repair this tear.  Continues to have pain in his right hip and at times feels as though there is locking up.   He also needs his Adderall refilled   Review of Systems See HPI   Past Medical History:  Diagnosis Date  . ADHD   . Seasonal allergies     Social History   Socioeconomic History  . Marital status: Single    Spouse name: Not on file  . Number of children: Not on file  . Years of education: Not on file  . Highest education level: Not on file  Occupational History  . Not on file  Social Needs  . Financial resource strain: Not on file  . Food insecurity:    Worry: Not on file    Inability: Not on file  . Transportation needs:    Medical: Not on file    Non-medical: Not on file  Tobacco Use  . Smoking status: Never Smoker  . Smokeless tobacco: Current User    Types: Chew  Substance and Sexual Activity  . Alcohol use: Yes    Alcohol/week: 3.0 standard drinks    Types: 3 Cans of beer per week  . Drug use: Not on file  . Sexual activity: Yes    Partners: Female  Lifestyle  . Physical activity:    Days per week: Not on file    Minutes per session: Not on file  . Stress: Not on file  Relationships  . Social connections:    Talks on phone: Not on file    Gets together: Not on file    Attends religious service: Not on file    Active member of club or organization: Not on file    Attends meetings of clubs or organizations: Not on file    Relationship status: Not on file  . Intimate partner violence:    Fear of current or ex partner: Not on file    Emotionally abused: Not on file   Physically abused: Not on file    Forced sexual activity: Not on file  Other Topics Concern  . Not on file  Social History Narrative  . Not on file    No past surgical history on file.  Family History  Problem Relation Age of Onset  . High blood pressure Father   . High Cholesterol Father   . Benign prostatic hyperplasia Father   . Glaucoma Father     No Known Allergies  Current Outpatient Medications on File Prior to Visit  Medication Sig Dispense Refill  . amphetamine-dextroamphetamine (ADDERALL) 20 MG tablet Take 1 tablet (20 mg total) by mouth 2 (two) times daily. 60 tablet 0  . amphetamine-dextroamphetamine (ADDERALL) 20 MG tablet Take 1 tablet (20 mg total) by mouth 2 (two) times daily. 60 tablet 0  . amphetamine-dextroamphetamine (ADDERALL) 20 MG tablet Take 1 tablet (20 mg total) by mouth 2 (two) times daily. 60 tablet 0  . cetirizine (ZYRTEC) 10 MG tablet Take 10 mg by mouth daily.    . cyclobenzaprine (FLEXERIL) 5 MG  tablet Take 1 tablet (5 mg total) by mouth at bedtime. 15 tablet 0  . fluticasone (FLONASE) 50 MCG/ACT nasal spray Place 2 sprays into both nostrils daily. 16 g 6  . predniSONE (STERAPRED UNI-PAK 21 TAB) 10 MG (21) TBPK tablet Take by mouth daily. Per box instruct 21 tablet 0   No current facility-administered medications on file prior to visit.     BP 118/72   Temp 97.8 F (36.6 C)   Wt 247 lb (112 kg)       Objective:   Physical Exam Vitals signs and nursing note reviewed.  Constitutional:      Appearance: Normal appearance.  HENT:     Mouth/Throat:     Mouth: Mucous membranes are moist.     Pharynx: Oropharynx is clear.  Cardiovascular:     Rate and Rhythm: Normal rate and regular rhythm.     Pulses: Normal pulses.     Heart sounds: Normal heart sounds.  Pulmonary:     Effort: Pulmonary effort is normal.     Breath sounds: Normal breath sounds.  Abdominal:     General: Abdomen is flat.     Palpations: Abdomen is soft.   Musculoskeletal:        General: Tenderness (Right hip and groin) present. No deformity.     Right lower leg: No edema.     Left lower leg: No edema.  Skin:    General: Skin is warm and dry.     Capillary Refill: Capillary refill takes less than 2 seconds.  Neurological:     General: No focal deficit present.     Mental Status: He is alert and oriented to person, place, and time.  Psychiatric:        Mood and Affect: Mood normal.        Behavior: Behavior normal.        Thought Content: Thought content normal.        Judgment: Judgment normal.       Assessment & Plan:  1. Pre-op evaluation -We will have surgical center fax clearance form.  From a medical standpoint he is cleared for surgery - DG Chest 2 View; Future - Basic metabolic panel - CMP - APTT - DG Chest 2 View  2. Attention deficit hyperactivity disorder (ADHD), predominantly inattentive type  - amphetamine-dextroamphetamine (ADDERALL) 20 MG tablet; Take 1 tablet (20 mg total) by mouth 2 (two) times daily.  Dispense: 60 tablet; Refill: 0 - amphetamine-dextroamphetamine (ADDERALL) 20 MG tablet; Take 1 tablet (20 mg total) by mouth 2 (two) times daily.  Dispense: 60 tablet; Refill: 0 - amphetamine-dextroamphetamine (ADDERALL) 20 MG tablet; Take 1 tablet (20 mg total) by mouth 2 (two) times daily.  Dispense: 60 tablet; Refill: 0   Shirline Frees, NP

## 2018-07-16 ENCOUNTER — Other Ambulatory Visit: Payer: Self-pay | Admitting: Adult Health

## 2018-07-16 DIAGNOSIS — J0140 Acute pansinusitis, unspecified: Secondary | ICD-10-CM

## 2018-07-17 NOTE — Telephone Encounter (Signed)
Sent to the pharmacy by e-scribe for 90 days. 

## 2018-11-15 ENCOUNTER — Encounter: Payer: Self-pay | Admitting: Adult Health

## 2018-11-15 ENCOUNTER — Other Ambulatory Visit: Payer: Self-pay

## 2018-11-15 ENCOUNTER — Ambulatory Visit (INDEPENDENT_AMBULATORY_CARE_PROVIDER_SITE_OTHER): Payer: BC Managed Care – PPO | Admitting: Adult Health

## 2018-11-15 VITALS — BP 122/76 | Temp 98.2°F | Ht 72.0 in | Wt 252.0 lb

## 2018-11-15 DIAGNOSIS — Z0000 Encounter for general adult medical examination without abnormal findings: Secondary | ICD-10-CM

## 2018-11-15 DIAGNOSIS — F900 Attention-deficit hyperactivity disorder, predominantly inattentive type: Secondary | ICD-10-CM

## 2018-11-15 DIAGNOSIS — Z23 Encounter for immunization: Secondary | ICD-10-CM | POA: Diagnosis not present

## 2018-11-15 LAB — TSH: TSH: 2.13 u[IU]/mL (ref 0.35–4.50)

## 2018-11-15 LAB — COMPREHENSIVE METABOLIC PANEL
ALT: 34 U/L (ref 0–53)
AST: 22 U/L (ref 0–37)
Albumin: 4.7 g/dL (ref 3.5–5.2)
Alkaline Phosphatase: 46 U/L (ref 39–117)
BUN: 13 mg/dL (ref 6–23)
CO2: 28 mEq/L (ref 19–32)
Calcium: 9.4 mg/dL (ref 8.4–10.5)
Chloride: 101 mEq/L (ref 96–112)
Creatinine, Ser: 0.88 mg/dL (ref 0.40–1.50)
GFR: 99.73 mL/min (ref >60.00)
Glucose, Bld: 79 mg/dL (ref 70–99)
Potassium: 3.9 mEq/L (ref 3.5–5.1)
Sodium: 138 mEq/L (ref 135–145)
Total Bilirubin: 0.6 mg/dL (ref 0.2–1.2)
Total Protein: 7.4 g/dL (ref 6.0–8.3)

## 2018-11-15 LAB — CBC WITH DIFFERENTIAL/PLATELET
Basophils Absolute: 0 10*3/uL (ref 0.0–0.1)
Basophils Relative: 0.5 % (ref 0.0–3.0)
Eosinophils Absolute: 0 10*3/uL (ref 0.0–0.7)
Eosinophils Relative: 0.5 % (ref 0.0–5.0)
HCT: 44 % (ref 39.0–52.0)
Hemoglobin: 15 g/dL (ref 13.0–17.0)
Lymphocytes Relative: 32.1 % (ref 12.0–46.0)
Lymphs Abs: 1.7 10*3/uL (ref 0.7–4.0)
MCHC: 34.2 g/dL (ref 30.0–36.0)
MCV: 96.2 fl (ref 78.0–100.0)
Monocytes Absolute: 0.3 10*3/uL (ref 0.1–1.0)
Monocytes Relative: 6 % (ref 3.0–12.0)
Neutro Abs: 3.3 10*3/uL (ref 1.4–7.7)
Neutrophils Relative %: 60.9 % (ref 43.0–77.0)
Platelets: 250 10*3/uL (ref 150.0–400.0)
RBC: 4.58 Mil/uL (ref 4.22–5.81)
RDW: 12.6 % (ref 11.5–15.5)
WBC: 5.3 10*3/uL (ref 4.0–10.5)

## 2018-11-15 LAB — LIPID PANEL
Cholesterol: 199 mg/dL (ref 0–200)
HDL: 58.4 mg/dL (ref >39.00)
LDL Cholesterol: 105 mg/dL — ABNORMAL HIGH (ref 0–99)
NonHDL: 140.9
Total CHOL/HDL Ratio: 3
Triglycerides: 181 mg/dL — ABNORMAL HIGH (ref 0.0–149.0)
VLDL: 36.2 mg/dL (ref 0.0–40.0)

## 2018-11-15 NOTE — Addendum Note (Signed)
Addended by: Miles Costain T on: 11/15/2018 04:16 PM   Modules accepted: Orders

## 2018-11-15 NOTE — Patient Instructions (Signed)
It was great seeing you today   We will follow up with you regarding your blood work   I hope you enjoy every aspect of being a new dad! Congrats!   Please let me know if you need anything

## 2018-11-15 NOTE — Progress Notes (Signed)
Subjective:    Patient ID: Walter Haney, male    DOB: October 17, 1985, 33 y.o.   MRN: [id number]  HPI  Patient presents for yearly preventative medicine examination. He is a pleasant 33 year old male who  has a past medical history of ADHD and Seasonal allergies.  ADHD -currently prescribed Adderall 20 mg twice daily.  He feels as though he has very good control on this medication.  Seasonal Allergies - Controlled with Zyrtec and Flonase  All immunizations and health maintenance protocols were reviewed with the patient and needed orders were placed. He is due for influenza vaccination   Appropriate screening laboratory values were ordered for the patient including screening of hyperlipidemia, renal function and hepatic function.  Medication reconciliation,  past medical history, social history, problem list and allergies were reviewed in detail with the patient  Goals were established with regard to weight loss, exercise, and  diet in compliance with medications. He has been trying to exercise but is still going through PT for right acetabular labrum repair in May 2020.  Wt Readings from Last 3 Encounters:  11/15/18 252 lb (114.3 kg)  05/18/18 247 lb (112 kg)  10/11/17 251 lb (113.9 kg)    End of life planning was discussed.   Review of Systems  Constitutional: Negative.   HENT: Negative.   Eyes: Negative.   Respiratory: Negative.   Cardiovascular: Negative.   Gastrointestinal: Negative.   Endocrine: Negative.   Genitourinary: Negative.   Skin: Negative.   Allergic/Immunologic: Negative.   Neurological: Negative.   Hematological: Negative.   Psychiatric/Behavioral: Negative.   All other systems reviewed and are negative.  Past Medical History:  Diagnosis Date  . ADHD   . Seasonal allergies     Social History   Socioeconomic History  . Marital status: Single    Spouse name: Not on file  . Number of children: Not on file  . Years of education: Not on file  .  Highest education level: Not on file  Occupational History  . Not on file  Social Needs  . Financial resource strain: Not on file  . Food insecurity    Worry: Not on file    Inability: Not on file  . Transportation needs    Medical: Not on file    Non-medical: Not on file  Tobacco Use  . Smoking status: Never Smoker  . Smokeless tobacco: Current User    Types: Chew  Substance and Sexual Activity  . Alcohol use: Yes    Alcohol/week: 3.0 standard drinks    Types: 3 Cans of beer per week  . Drug use: Not on file  . Sexual activity: Yes    Partners: Female  Lifestyle  . Physical activity    Days per week: Not on file    Minutes per session: Not on file  . Stress: Not on file  Relationships  . Social Herbalist on phone: Not on file    Gets together: Not on file    Attends religious service: Not on file    Active member of club or organization: Not on file    Attends meetings of clubs or organizations: Not on file    Relationship status: Not on file  . Intimate partner violence    Fear of current or ex partner: Not on file    Emotionally abused: Not on file    Physically abused: Not on file    Forced sexual activity: Not on  file  Other Topics Concern  . Not on file  Social History Narrative  . Not on file    History reviewed. No pertinent surgical history.  Family History  Problem Relation Age of Onset  . High blood pressure Father   . High Cholesterol Father   . Benign prostatic hyperplasia Father   . Glaucoma Father     No Known Allergies  Current Outpatient Medications on File Prior to Visit  Medication Sig Dispense Refill  . amphetamine-dextroamphetamine (ADDERALL) 20 MG tablet Take 1 tablet (20 mg total) by mouth 2 (two) times daily. 60 tablet 0  . amphetamine-dextroamphetamine (ADDERALL) 20 MG tablet Take 1 tablet (20 mg total) by mouth 2 (two) times daily. 60 tablet 0  . amphetamine-dextroamphetamine (ADDERALL) 20 MG tablet Take 1 tablet (20  mg total) by mouth 2 (two) times daily. 60 tablet 0  . cetirizine (ZYRTEC) 10 MG tablet Take 10 mg by mouth daily.    . fluticasone (FLONASE) 50 MCG/ACT nasal spray SPRAY 2 SPRAYS INTO EACH NOSTRIL EVERY DAY (Patient not taking: Reported on 11/15/2018) 48 g 0   No current facility-administered medications on file prior to visit.     BP 122/76   Temp 98.2 F (36.8 C) (Temporal)   Ht 6' (1.829 m)   Wt 252 lb (114.3 kg)   BMI 34.18 kg/m       Objective:   Physical Exam Vitals signs and nursing note reviewed.  Constitutional:      General: He is not in acute distress.    Appearance: Normal appearance. He is normal weight. He is not diaphoretic.  HENT:     Head: Normocephalic and atraumatic.     Right Ear: Tympanic membrane, ear canal and external ear normal. There is no impacted cerumen.     Left Ear: Tympanic membrane, ear canal and external ear normal. There is no impacted cerumen.     Nose: Nose normal. No congestion or rhinorrhea.     Mouth/Throat:     Mouth: Mucous membranes are moist.     Pharynx: Oropharynx is clear. No oropharyngeal exudate or posterior oropharyngeal erythema.  Eyes:     General: No scleral icterus.       Right eye: No discharge.        Left eye: No discharge.     Extraocular Movements: Extraocular movements intact.     Conjunctiva/sclera: Conjunctivae normal.     Pupils: Pupils are equal, round, and reactive to light.  Neck:     Musculoskeletal: Normal range of motion and neck supple. No muscular tenderness.     Thyroid: No thyromegaly.     Vascular: No carotid bruit or JVD.     Trachea: No tracheal deviation.  Cardiovascular:     Rate and Rhythm: Normal rate and regular rhythm.     Pulses: Normal pulses.     Heart sounds: Normal heart sounds. No murmur. No friction rub. No gallop.   Pulmonary:     Effort: Pulmonary effort is normal. No respiratory distress.     Breath sounds: Normal breath sounds. No stridor. No wheezing, rhonchi or rales.   Chest:     Chest wall: No tenderness.  Abdominal:     General: Bowel sounds are normal. There is no distension.     Palpations: Abdomen is soft. There is no mass.     Tenderness: There is no abdominal tenderness. There is no right CVA tenderness, left CVA tenderness, guarding or rebound.     Hernia:  No hernia is present.  Genitourinary:    Rectum: Guaiac result negative.  Musculoskeletal: Normal range of motion.        General: No swelling, tenderness, deformity or signs of injury.     Right lower leg: No edema.     Left lower leg: No edema.  Lymphadenopathy:     Cervical: No cervical adenopathy.  Skin:    General: Skin is warm and dry.     Coloration: Skin is not jaundiced or pale.     Findings: No bruising, erythema, lesion or rash.  Neurological:     General: No focal deficit present.     Mental Status: He is alert and oriented to person, place, and time.     Cranial Nerves: No cranial nerve deficit.     Sensory: No sensory deficit.     Motor: No weakness or abnormal muscle tone.     Coordination: Coordination normal.     Gait: Gait normal.     Deep Tendon Reflexes: Reflexes are normal and symmetric. Reflexes normal.  Psychiatric:        Mood and Affect: Mood normal.        Behavior: Behavior normal.        Thought Content: Thought content normal.        Judgment: Judgment normal.       Assessment & Plan:  1. Routine general medical examination at a health care facility - Encouraged heart healthy diet and exercise  - Follow up in one year or sooner if needed - CBC with Differential/Platelet - Comprehensive metabolic panel - Lipid panel - TSH  2. Attention deficit hyperactivity disorder (ADHD), predominantly inattentive type - Continue with Adderall 20 mg BID   Shirline Freesory Hardy Harcum, NP

## 2018-12-04 ENCOUNTER — Other Ambulatory Visit: Payer: Self-pay | Admitting: Adult Health

## 2018-12-04 DIAGNOSIS — J0140 Acute pansinusitis, unspecified: Secondary | ICD-10-CM

## 2018-12-04 DIAGNOSIS — F900 Attention-deficit hyperactivity disorder, predominantly inattentive type: Secondary | ICD-10-CM

## 2018-12-04 NOTE — Telephone Encounter (Signed)
Pt called and is requesting to have this refilled as well. PT states he only has enough to last until tomorrow. Please advise. amphetamine-dextroamphetamine (ADDERALL) 20 MG tablet

## 2018-12-05 MED ORDER — AMPHETAMINE-DEXTROAMPHETAMINE 20 MG PO TABS: 20.0000 mg | ORAL_TABLET | Freq: Two times a day (BID) | ORAL | 0 refills | Status: DC

## 2019-03-07 DIAGNOSIS — R519 Headache, unspecified: Secondary | ICD-10-CM | POA: Diagnosis not present

## 2019-03-07 DIAGNOSIS — R05 Cough: Secondary | ICD-10-CM | POA: Diagnosis not present

## 2019-03-07 DIAGNOSIS — Z20828 Contact with and (suspected) exposure to other viral communicable diseases: Secondary | ICD-10-CM | POA: Diagnosis not present

## 2019-03-08 ENCOUNTER — Telehealth (INDEPENDENT_AMBULATORY_CARE_PROVIDER_SITE_OTHER): Payer: BC Managed Care – PPO | Admitting: Adult Health

## 2019-03-08 ENCOUNTER — Other Ambulatory Visit: Payer: Self-pay

## 2019-03-08 ENCOUNTER — Telehealth: Payer: Self-pay | Admitting: *Deleted

## 2019-03-08 DIAGNOSIS — J0100 Acute maxillary sinusitis, unspecified: Secondary | ICD-10-CM

## 2019-03-08 MED ORDER — DOXYCYCLINE HYCLATE 100 MG PO CAPS: 100.0000 mg | ORAL_CAPSULE | Freq: Two times a day (BID) | ORAL | 0 refills | Status: DC

## 2019-03-08 NOTE — Telephone Encounter (Signed)
Left a message for a return call.

## 2019-03-08 NOTE — Telephone Encounter (Signed)
Patient was transferred from [facility] to schedule virtual visit with any provider available. Offered virtual with Dr. Maudie Mercury at 1 pm, patient stated could he just talk to [facility] or Tommi Rumps because they know his situation with sinus issues and maybe something can be called in without an appointment. Please advise.

## 2019-03-08 NOTE — Telephone Encounter (Signed)
Spoke to the pt.  He states he had a rapid Covid test that came back negative on 03/07/2019.  Today he complains of sinus infection but states his headache is different that usual.  Instead of the the usual frontal pain the pain is all over his head.  Advised he should be evaluated by [facility] and added to the schedule.  Nothing further needed.

## 2019-03-08 NOTE — Progress Notes (Signed)
Virtual Visit via Video Note  I connected with Walter Haney on 03/08/19 at  9:00 AM EST by a video enabled telemedicine application and verified that I am speaking with the correct person using two identifiers.  Location patient: home Location provider:work or home office Persons participating in the virtual visit: patient, provider  I discussed the limitations of evaluation and management by telemedicine and the availability of in person appointments. The patient expressed understanding and agreed to proceed.   HPI:  33 year old male who is being evaluated today for an acute issue.  Symptoms started 4 days ago.  Symptoms include a productive cough with green mucus, sinus pain and pressure, chills but no fever, and mild headache around the temples.  He denies body aches, fatigue, loss of taste or smell.  Did have a rapid Covid test done yesterday which was negative.  Other family members are sick and he has not been around anybody that has been a known Covid positive  He does have a history of sinus infections and feels as though this is similar.  ROS: See pertinent positives and negatives per HPI.  Past Medical History:  Diagnosis Date  . ADHD   . Seasonal allergies     No past surgical history on file.  Family History  Problem Relation Age of Onset  . High blood pressure Father   . High Cholesterol Father   . Benign prostatic hyperplasia Father   . Glaucoma Father       Current Outpatient Medications:  .  amphetamine-dextroamphetamine (ADDERALL) 20 MG tablet, Take 1 tablet (20 mg total) by mouth 2 (two) times daily., Disp: 60 tablet, Rfl: 0 .  amphetamine-dextroamphetamine (ADDERALL) 20 MG tablet, Take 1 tablet (20 mg total) by mouth 2 (two) times daily., Disp: 60 tablet, Rfl: 0 .  amphetamine-dextroamphetamine (ADDERALL) 20 MG tablet, Take 1 tablet (20 mg total) by mouth 2 (two) times daily., Disp: 60 tablet, Rfl: 0 .  cetirizine (ZYRTEC) 10 MG tablet, Take 10 mg by  mouth daily., Disp: , Rfl:  .  fluticasone (FLONASE) 50 MCG/ACT nasal spray, SPRAY 2 SPRAYS INTO EACH NOSTRIL EVERY DAY, Disp: 48 mL, Rfl: 3  EXAM:  VITALS per patient if applicable:  GENERAL: alert, oriented, appears well and in no acute distress  HEENT: atraumatic, conjunttiva clear, no obvious abnormalities on inspection of external nose and ears  NECK: normal movements of the head and neck  LUNGS: on inspection no signs of respiratory distress, breathing rate appears normal, no obvious gross SOB, gasping or wheezing  CV: no obvious cyanosis  MS: moves all visible extremities without noticeable abnormality  PSYCH/NEURO: pleasant and cooperative, no obvious depression or anxiety, speech and thought processing grossly intact  ASSESSMENT AND PLAN:  Discussed the following assessment and plan:  1. Acute non-recurrent maxillary sinusitis -Likely sinusitis.  It is too soon to tell if it is bacterial or viral.  He was advised on holding off on antibiotics until Saturday at the earliest.  If his symptoms are not improving at this time then he can start antibiotics.  If they are improving then he was advised not to start antibiotics. - doxycycline (VIBRAMYCIN) 100 MG capsule; Take 1 capsule (100 mg total) by mouth 2 (two) times daily.  Dispense: 14 capsule; Refill: 0     I discussed the assessment and treatment plan with the patient. The patient was provided an opportunity to ask questions and all were answered. The patient agreed with the plan and demonstrated an understanding  of the instructions.   The patient was advised to call back or seek an in-person evaluation if the symptoms worsen or if the condition fails to improve as anticipated.   Dorothyann Peng, NP

## 2019-03-08 NOTE — Telephone Encounter (Signed)
Spoke to PCP CMA and she advised that pt will need a VV. Spoke to pt to schedule a VV but VV time was now unavailable. Pt advised to go to local UC or ED. Pt verbalized understanding.

## 2019-05-14 ENCOUNTER — Telehealth: Payer: Self-pay | Admitting: Adult Health

## 2019-05-14 DIAGNOSIS — F900 Attention-deficit hyperactivity disorder, predominantly inattentive type: Secondary | ICD-10-CM

## 2019-05-14 NOTE — Telephone Encounter (Signed)
Medication Refill: Adderall  Pharmacy: [facility]  Phone:  Please give pt a call to confirm

## 2019-05-15 ENCOUNTER — Telehealth: Payer: Self-pay | Admitting: Family Medicine

## 2019-05-15 MED ORDER — AMPHETAMINE-DEXTROAMPHETAMINE 20 MG PO TABS: 20.0000 mg | ORAL_TABLET | Freq: Two times a day (BID) | ORAL | 0 refills | Status: DC

## 2019-05-15 NOTE — Telephone Encounter (Signed)
Spoke to the pt again.  Asked if the pharmacy has a copy of the insurance card and he stated that he does not think so.  He will go by the pharmacy and give them new information.  A PA may not be needed.  Will close note.

## 2019-05-15 NOTE — Telephone Encounter (Signed)
Tried to do PA on CoverMyMeds.  No coverage found.  Called the pt and he stated he has a new insurance that took effect in Jan 2021.  He will send a copy through MyChart later this afternoon.

## 2019-05-22 ENCOUNTER — Telehealth: Payer: Self-pay | Admitting: Adult Health

## 2019-05-22 NOTE — Telephone Encounter (Signed)
error 

## 2019-05-22 NOTE — Telephone Encounter (Signed)
The patient came by to update [employer]. I have the insurance updated in his chart and scanned in.

## 2019-05-22 NOTE — Telephone Encounter (Signed)
Tried reaching the pt by telephone to see if he has updated his insurance information at the pharmacy.  Received an automated message that the voicemail box is full and cannot accept messages.

## 2019-05-22 NOTE — Telephone Encounter (Signed)
Pt wanted to inform that he has updated Geographical information systems officer and uploaded a copy of his card for pharmacy.

## 2019-05-23 NOTE — Telephone Encounter (Signed)
Tried to reach the pt to inform him that his PA has been approved.  Received an automated message that his voicemail box is full.  Will try again at a later time.

## 2019-05-23 NOTE — Telephone Encounter (Signed)
Caro Laroche III Key: [id number] - PA Case ID: [id number] - Rx #: [id number] Need help? Call us at [phone] Outcome Approved today CaseId:[id number];Status:Approved;Review Type:Prior Auth;Coverage Start Date:05/23/2019;Coverage End Date:05/22/2020;

## 2019-05-23 NOTE — Telephone Encounter (Signed)
Pt was returning Misty's call. Informed pt that his PA was approved. Pt had no questions for [facility]

## 2019-08-01 IMAGING — DX DG HIP (WITH OR WITHOUT PELVIS) 2-3V*R*
3 series · 3 of 3 positions shown · non-contrast
Comparison: None.

CLINICAL DATA: 32-year-old who fell and injured the RIGHT hip.
LATERAL hip pain radiating to the groin. Initial encounter.

EXAM:
DG HIP (WITH OR WITHOUT PELVIS) 2-3V RIGHT

[pelvis ap]
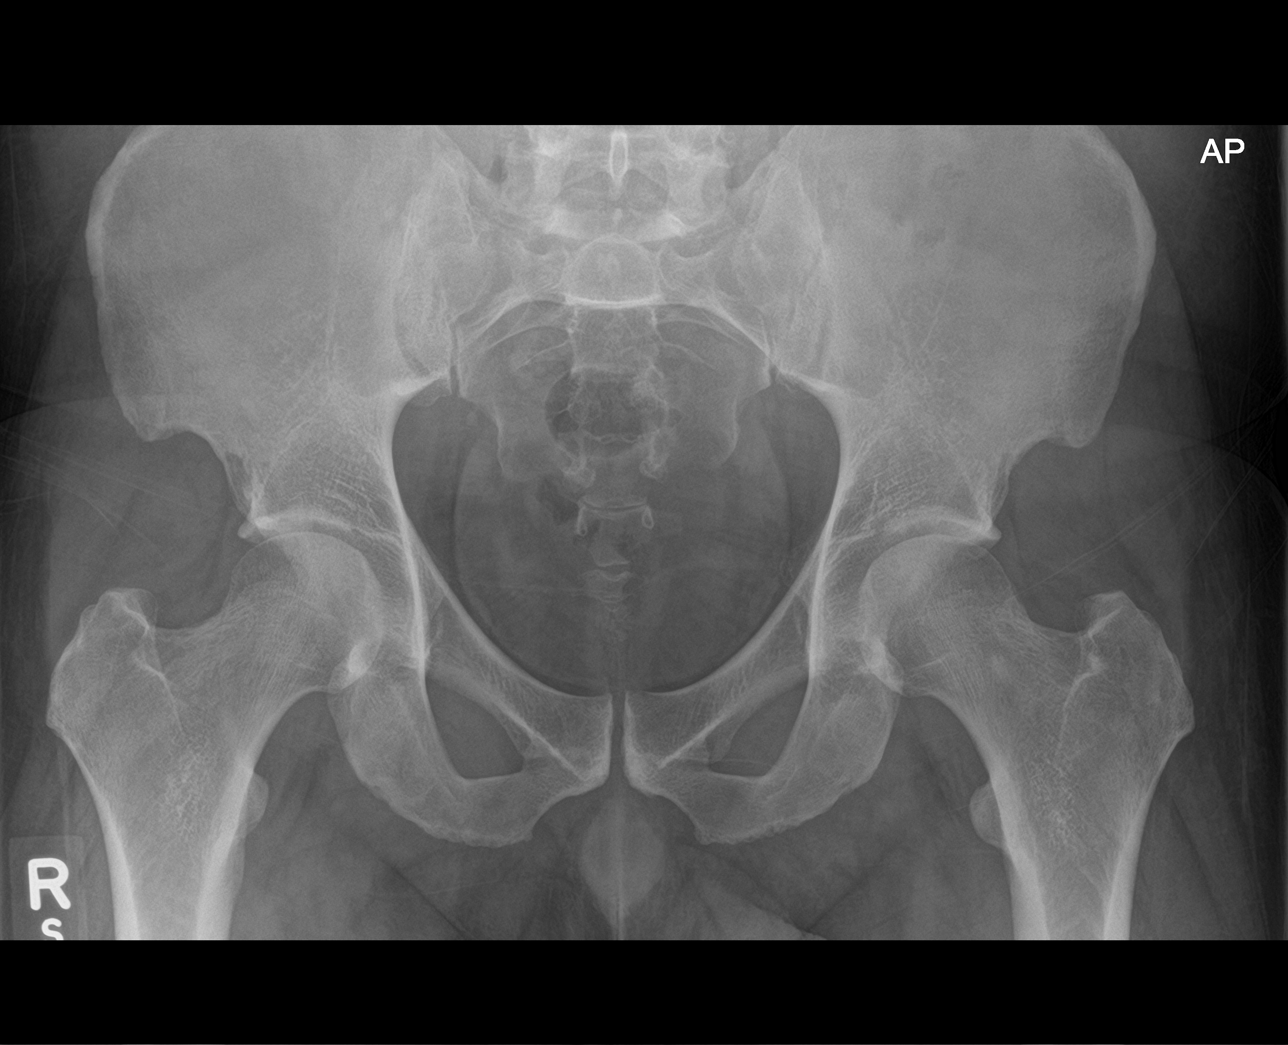

[hip ap]
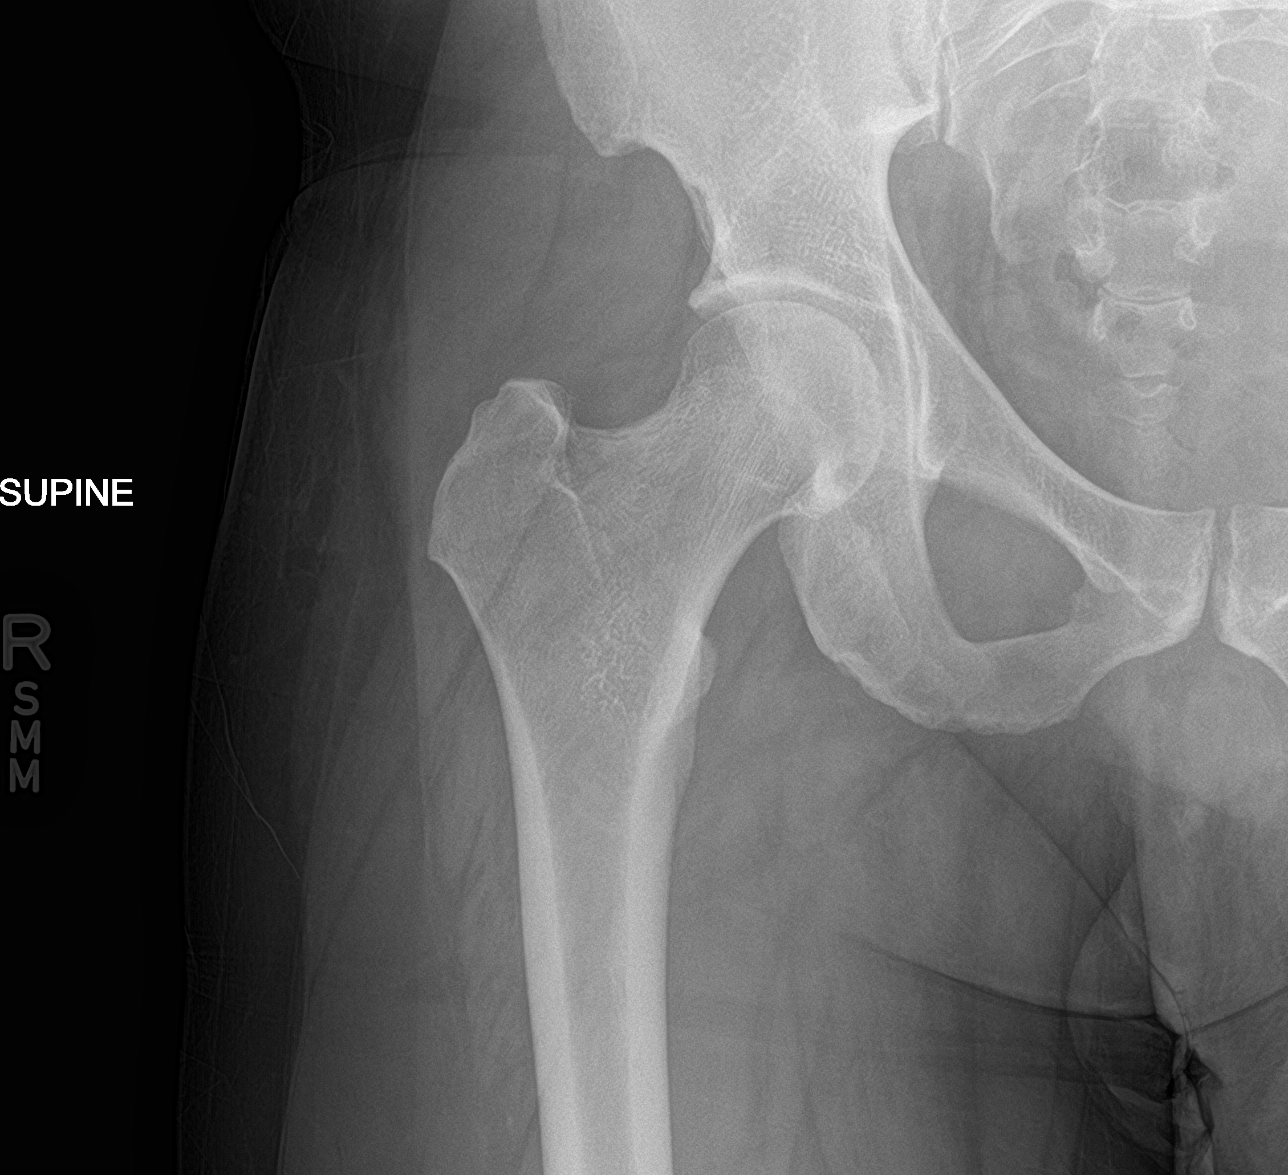

[hip lat]
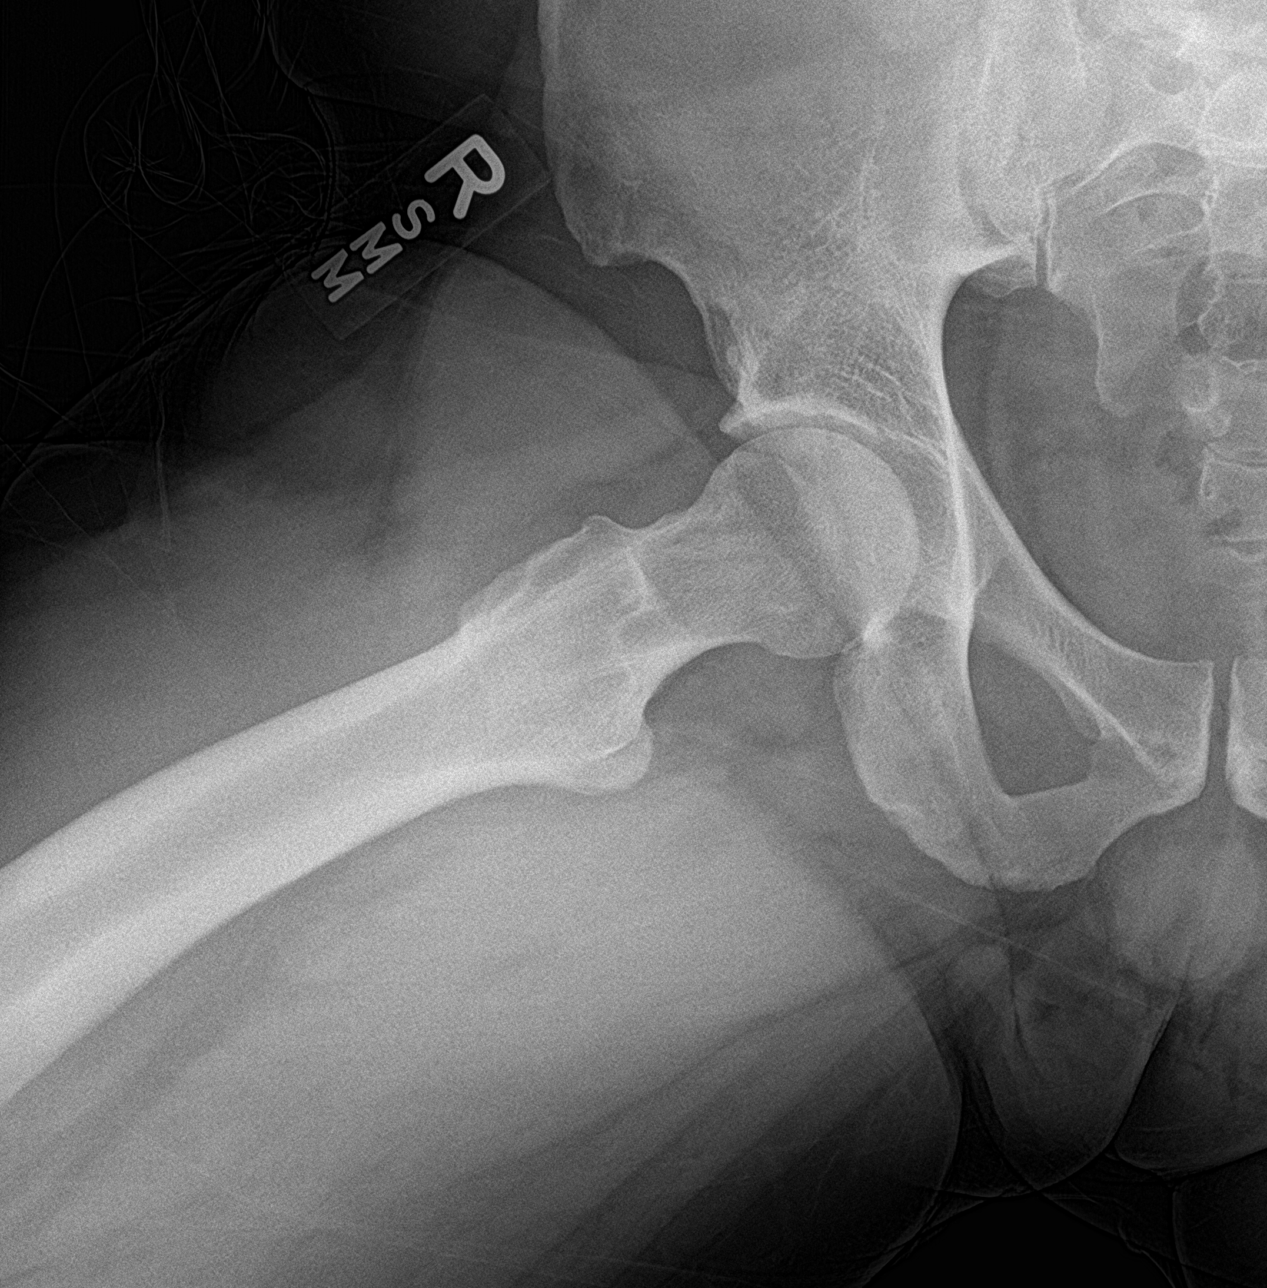

[3 of 3 positions shown; findings below may reference images not displayed]

FINDINGS: No evidence of acute fracture or dislocation. Well preserved joint
space. Well preserved bone mineral density. No intrinsic osseous
abnormality.

Included AP pelvis demonstrates a normal-appearing contralateral
LEFT hip. Sacroiliac joints and symphysis pubis intact. Visualized
lower lumbar spine unremarkable.
IMPRESSION: Normal examination.

## 2019-10-26 ENCOUNTER — Telehealth: Payer: Self-pay | Admitting: Adult Health

## 2019-10-26 NOTE — Telephone Encounter (Signed)
Pt returned call and I advised pt to schedule a physical. Pt will call back to schedule as I am not aware of CPE schedule for [facility]. Pt verbalized understanding.

## 2019-10-26 NOTE — Telephone Encounter (Signed)
Pt is calling to get a refill on his Rx amphetamine-dextroamphetamine ADDERALL 20 MG   [facility] [place] [place]

## 2019-11-06 ENCOUNTER — Telehealth (INDEPENDENT_AMBULATORY_CARE_PROVIDER_SITE_OTHER): Payer: Managed Care, Other (non HMO) | Admitting: Family Medicine

## 2019-11-06 ENCOUNTER — Other Ambulatory Visit: Payer: Self-pay

## 2019-11-06 DIAGNOSIS — R0981 Nasal congestion: Secondary | ICD-10-CM

## 2019-11-06 DIAGNOSIS — R05 Cough: Secondary | ICD-10-CM

## 2019-11-06 DIAGNOSIS — R519 Headache, unspecified: Secondary | ICD-10-CM

## 2019-11-06 DIAGNOSIS — F909 Attention-deficit hyperactivity disorder, unspecified type: Secondary | ICD-10-CM

## 2019-11-06 DIAGNOSIS — R059 Cough, unspecified: Secondary | ICD-10-CM

## 2019-11-06 MED ORDER — AMOXICILLIN-POT CLAVULANATE 875-125 MG PO TABS: 1.0000 | ORAL_TABLET | Freq: Two times a day (BID) | ORAL | 0 refills | Status: DC

## 2019-11-06 NOTE — Progress Notes (Signed)
Virtual Visit via Video Note  I connected with Walter Haney  on 11/06/19 at  1:20 PM EDT by a video enabled telemedicine application and verified that I am speaking with the correct person using two identifiers.  Location patient: home Location provider:work or home office Persons participating in the virtual visit: patient, provider  I discussed the limitations of evaluation and management by telemedicine and the availability of in person appointments. The patient expressed understanding and agreed to proceed.   HPI:  Acute visit for sinus issues: -started 2 weeks ago -symptoms include sinus congestion, cough, green mucus, ears full, some R maxillary sinus discomfort, feels more tired than usual - worse the last 5-6 days -tried musinex and otc cough medications -denies fever, loss of taste or smell, body aches, SOB, CP, inability to get out of bed or tolerate oral intake -not vaccinated for COVID19 -did not get a COVID19 test this illness as test sites were booked up -reports has a history of sinus infections  -requests Adderall refills, reports takes for ADHD and is stable, reports has follow up with PCP coming up soon for a physical   ROS: See pertinent positives and negatives per HPI.  Past Medical History:  Diagnosis Date  . ADHD   . Seasonal allergies     No past surgical history on file.  Family History  Problem Relation Age of Onset  . High blood pressure Father   . High Cholesterol Father   . Benign prostatic hyperplasia Father   . Glaucoma Father     SOCIAL HX: see hpi   Current Outpatient Medications:  .  amoxicillin-clavulanate (AUGMENTIN) 875-125 MG tablet, Take 1 tablet by mouth 2 (two) times daily., Disp: 20 tablet, Rfl: 0 .  amphetamine-dextroamphetamine (ADDERALL) 20 MG tablet, Take 1 tablet (20 mg total) by mouth 2 (two) times daily., Disp: 60 tablet, Rfl: 0 .  amphetamine-dextroamphetamine (ADDERALL) 20 MG tablet, Take 1 tablet (20 mg total) by mouth 2  (two) times daily., Disp: 60 tablet, Rfl: 0 .  amphetamine-dextroamphetamine (ADDERALL) 20 MG tablet, Take 1 tablet (20 mg total) by mouth 2 (two) times daily., Disp: 60 tablet, Rfl: 0 .  cetirizine (ZYRTEC) 10 MG tablet, Take 10 mg by mouth daily., Disp: , Rfl:  .  fluticasone (FLONASE) 50 MCG/ACT nasal spray, SPRAY 2 SPRAYS INTO EACH NOSTRIL EVERY DAY, Disp: 48 mL, Rfl: 3  EXAM:  VITALS per patient if applicable:denies fever  GENERAL: alert, oriented, appears well and in no acute distress  HEENT: atraumatic, conjunttiva clear, no obvious abnormalities on inspection of external nose and ears  NECK: normal movements of the head and neck  LUNGS: on inspection no signs of respiratory distress, breathing rate appears normal, no obvious gross SOB, gasping or wheezing  CV: no obvious cyanosis  MS: moves all visible extremities without noticeable abnormality  PSYCH/NEURO: pleasant and cooperative, no obvious depression or anxiety, speech and thought processing grossly intact  ASSESSMENT AND PLAN:  Discussed the following assessment and plan:  Sinus congestion  Facial pain  Cough  Attention deficit hyperactivity disorder (ADHD), unspecified ADHD type  -we discussed possible serious and likely etiologies, options for evaluation and workup, limitations of telemedicine visit vs in person visit, treatment, treatment risks and precautions. Pt prefers to treat via telemedicine empirically rather then risking or undertaking an in person visit at this moment. Query sinusitis given duration of symptoms, sinus pain, thick congestion and not improving. Opted for treatment with Augmentin 875 bid x 7-10 days. Discusse dpossibiliyt of  COVID, At this point testing may not help or change treatment and may not pick up acute disease, but he will try to get test. Advised can not rx controlled med via telemedicine, but will send message to PCP regarding his request for refills to see if needs appt or if it  can be refilled. Message sent.  Advised to seek prompt follow up telemedicine visit or in person care if worsening, new symptoms arise, or if is not improving with treatment.    I discussed the assessment and treatment plan with the patient. The patient was provided an opportunity to ask questions and all were answered. The patient agreed with the plan and demonstrated an understanding of the instructions.   The patient was advised to call back or seek an in-person evaluation if the symptoms worsen or if the condition fails to improve as anticipated.   Terressa Koyanagi, DO

## 2019-11-07 ENCOUNTER — Other Ambulatory Visit: Payer: Self-pay | Admitting: Adult Health

## 2019-11-07 DIAGNOSIS — F900 Attention-deficit hyperactivity disorder, predominantly inattentive type: Secondary | ICD-10-CM

## 2019-11-07 MED ORDER — AMPHETAMINE-DEXTROAMPHETAMINE 20 MG PO TABS: 20.0000 mg | ORAL_TABLET | Freq: Two times a day (BID) | ORAL | 0 refills | Status: DC

## 2019-12-08 IMAGING — DX CHEST - 2 VIEW
2 series · 2 of 2 positions shown · non-contrast
Comparison: None.

CLINICAL DATA: Preop.

EXAM:
CHEST - 2 VIEW

[chest pa]
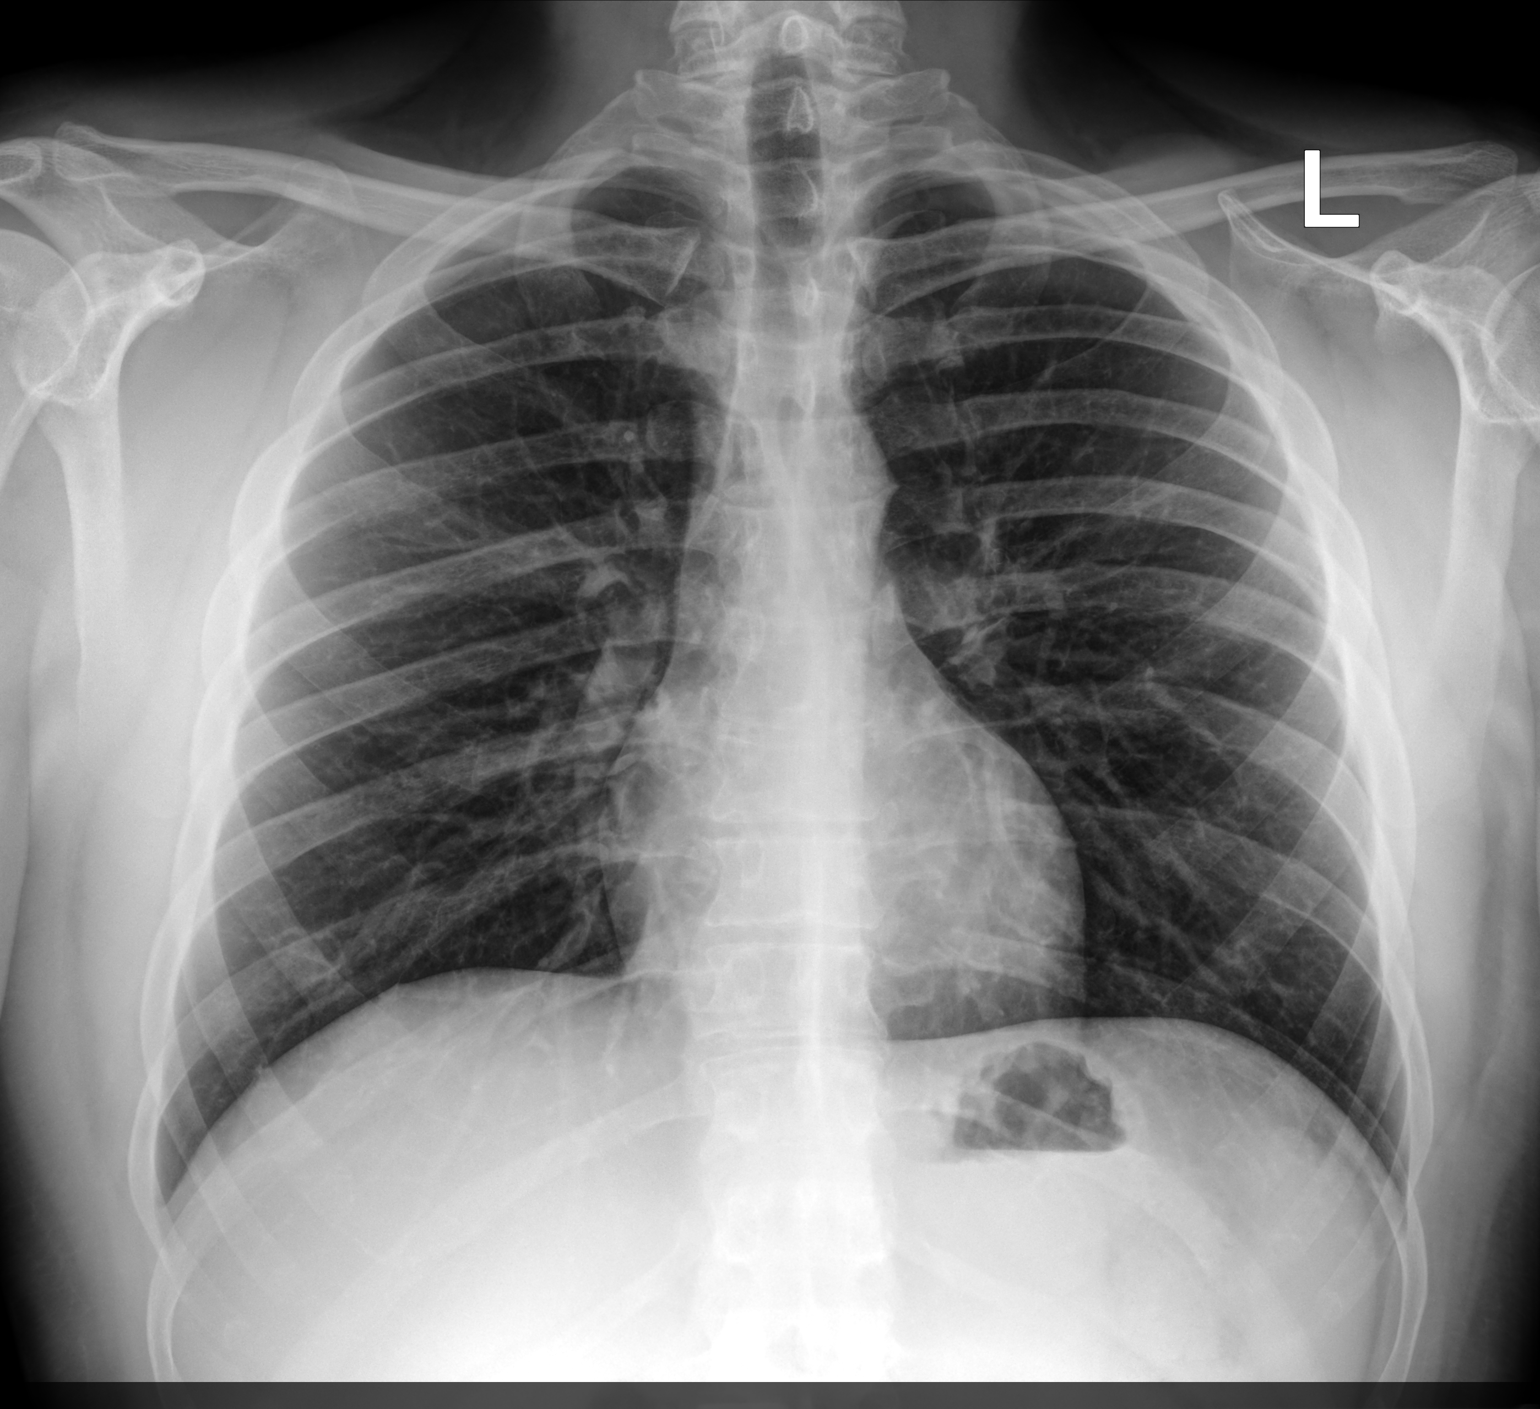

[chest lat]
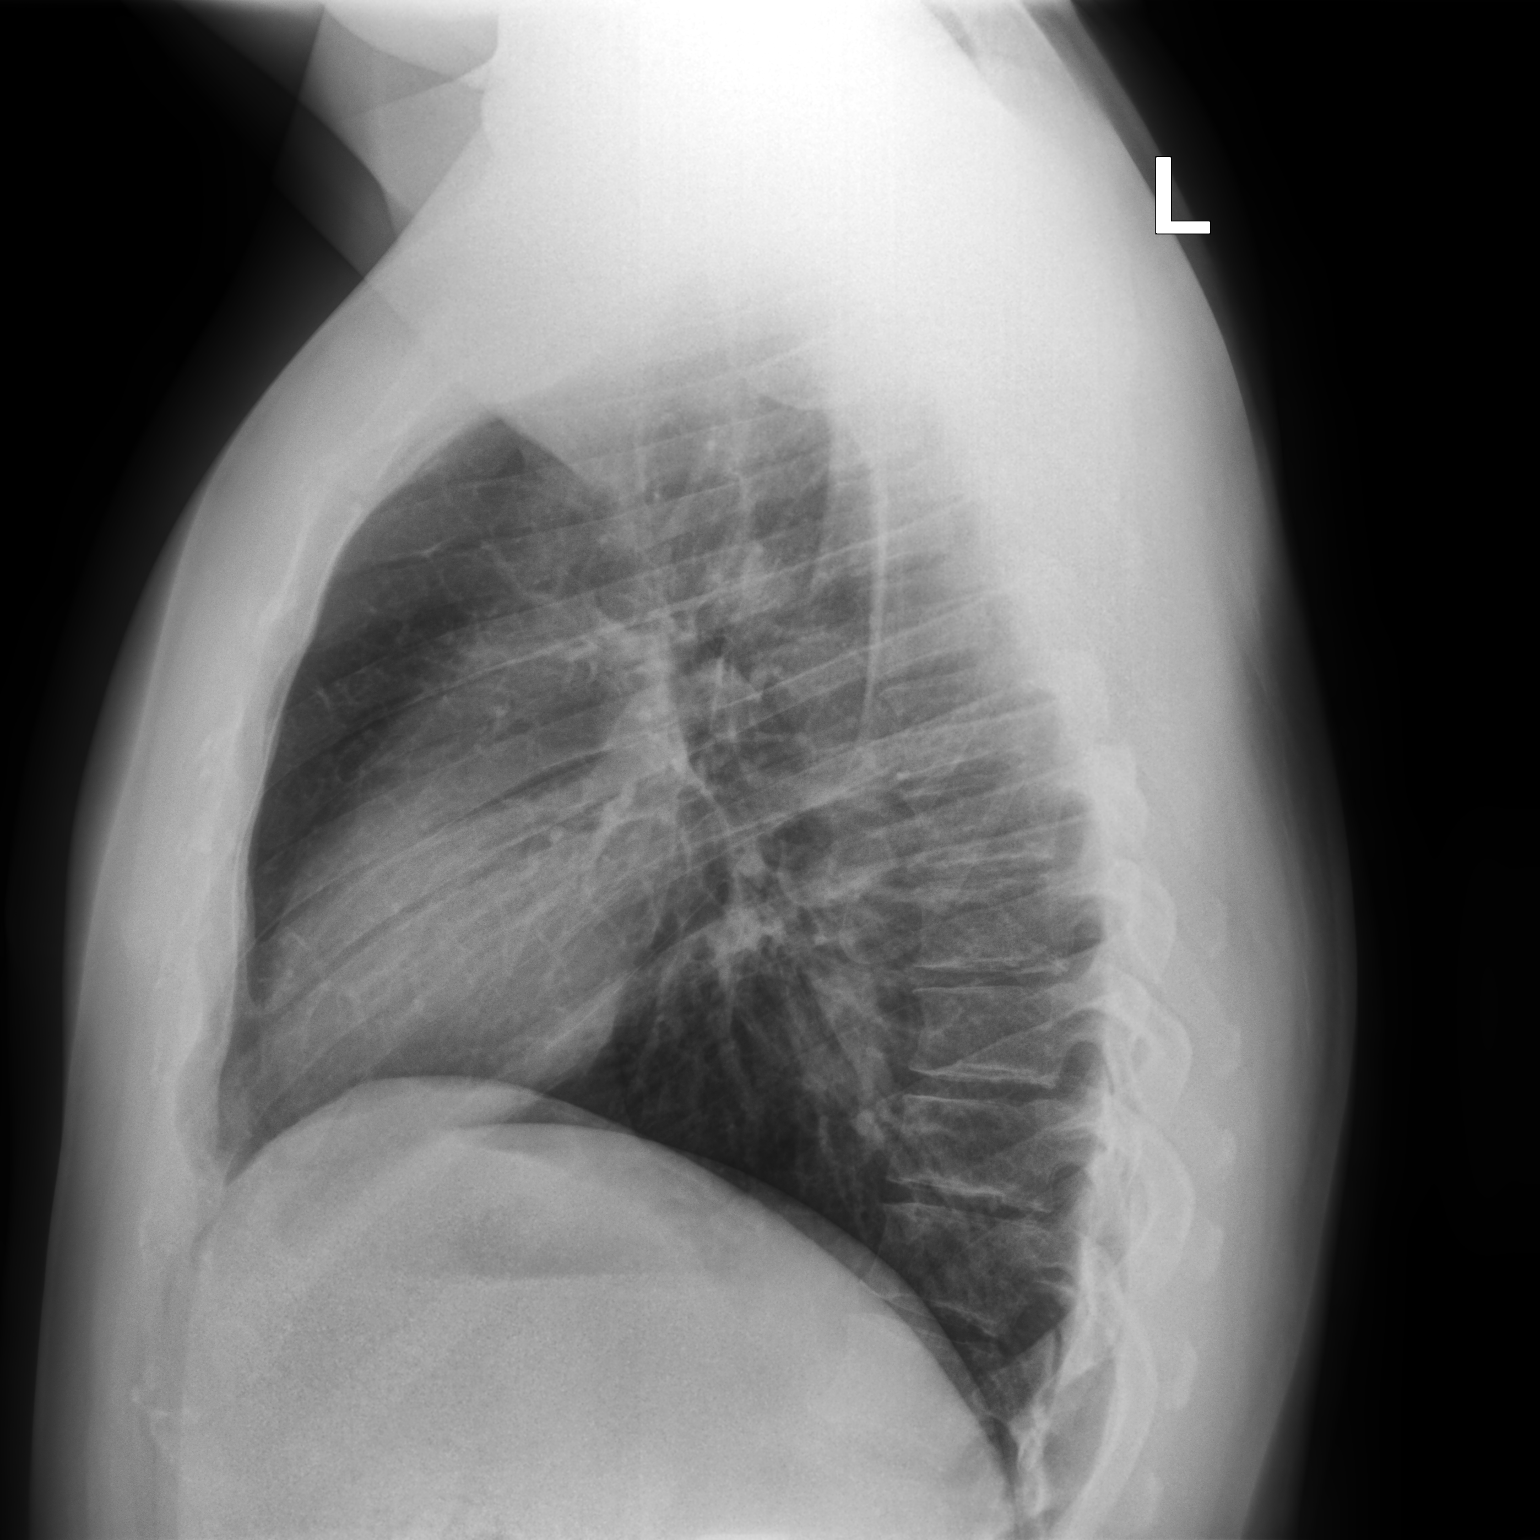

[2 of 2 positions shown; findings below may reference images not displayed]

FINDINGS: The heart size and mediastinal contours are within normal limits.
Both lungs are clear. The visualized skeletal structures are
unremarkable.
IMPRESSION: No active cardiopulmonary disease.

## 2020-01-04 ENCOUNTER — Ambulatory Visit (INDEPENDENT_AMBULATORY_CARE_PROVIDER_SITE_OTHER): Payer: Managed Care, Other (non HMO) | Admitting: Adult Health

## 2020-01-04 ENCOUNTER — Encounter: Payer: Self-pay | Admitting: Adult Health

## 2020-01-04 ENCOUNTER — Other Ambulatory Visit: Payer: Self-pay

## 2020-01-04 VITALS — BP 120/88 | HR 65 | Temp 98.4°F | Ht 72.05 in | Wt 246.4 lb

## 2020-01-04 DIAGNOSIS — F900 Attention-deficit hyperactivity disorder, predominantly inattentive type: Secondary | ICD-10-CM

## 2020-01-04 DIAGNOSIS — G479 Sleep disorder, unspecified: Secondary | ICD-10-CM | POA: Diagnosis not present

## 2020-01-04 DIAGNOSIS — R0981 Nasal congestion: Secondary | ICD-10-CM

## 2020-01-04 DIAGNOSIS — Z0000 Encounter for general adult medical examination without abnormal findings: Secondary | ICD-10-CM | POA: Diagnosis not present

## 2020-01-04 MED ORDER — DOXYCYCLINE HYCLATE 100 MG PO CAPS: 100.0000 mg | ORAL_CAPSULE | Freq: Two times a day (BID) | ORAL | 0 refills | Status: DC

## 2020-01-04 MED ORDER — TRAZODONE HCL 50 MG PO TABS: 25.0000 mg | ORAL_TABLET | Freq: Every evening | ORAL | 3 refills | Status: DC | PRN

## 2020-01-04 NOTE — Patient Instructions (Signed)
It was great seeing you today  We will follow up with you regarding your blood work   Please follow up in 6 months via virtual visit for ADHD

## 2020-01-04 NOTE — Progress Notes (Signed)
Subjective:    Patient ID: Walter Haney, male    DOB: 03/16/1985, 34 y.o.   MRN: [id number]  HPI Patient presents for yearly preventative medicine examination. He is a pleasant 34 year old male who  has a past medical history of ADHD and Seasonal allergies.  ADHD -currently prescribed Adderall 20 mg daily.  He feels as though things are well controlled with the medication.  He denies side effects such as insomnia or anorexia.  Seasonal allergies-is controlled with Zyrtec and Flonase.  Sleep Disturbance -he reports that over the last few months he has had difficulty sleeping most nights.  He is having trouble falling asleep and staying asleep.  On multiple nights he has tried melatonin with limited success and falling asleep and staying asleep.  Reports that when he is finally able to fall asleep he often wake up few hours later and cannot fall asleep so he gets up and starts his day.  Sinusitis -he was seen but I another provider in the office via virtual visit approximately 2 months ago for sinus congestion, cough, green mucus, feeling his ears are full and some right maxillary sinus discomfort.  Symptoms are present for 2 weeks and he was started on a course of Augmentin.  Reports that the Augmentin helped his symptoms but 3 days after finishing his treatment all of his symptoms came and have continued.  He denies fevers or chills.  Has been using his seasonal allergy medication daily.   All immunizations and health maintenance protocols were reviewed with the patient and needed orders were placed.  Appropriate screening laboratory values were ordered for the patient including screening of hyperlipidemia, renal function and hepatic function.   Medication reconciliation,  past medical history, social history, problem list and allergies were reviewed in detail with the patient  Goals were established with regard to weight loss, exercise, and  diet in compliance with medications  Wt  Readings from Last 3 Encounters:  01/04/20 246 lb 6.4 oz (111.8 kg)  11/15/18 252 lb (114.3 kg)  05/18/18 247 lb (112 kg)    Review of Systems  Constitutional: Positive for fatigue.  HENT: Positive for congestion, ear pain, sinus pressure and sinus pain. Negative for tinnitus and trouble swallowing.   Eyes: Negative.   Respiratory: Negative.   Cardiovascular: Negative.   Gastrointestinal: Negative.   Endocrine: Negative.   Genitourinary: Negative.   Musculoskeletal: Negative.   Skin: Negative.   Allergic/Immunologic: Negative.   Neurological: Negative.   Hematological: Negative.   Psychiatric/Behavioral: Positive for sleep disturbance.  All other systems reviewed and are negative.  Past Medical History:  Diagnosis Date  . ADHD   . Seasonal allergies     Social History   Socioeconomic History  . Marital status: Single    Spouse name: Not on file  . Number of children: Not on file  . Years of education: Not on file  . Highest education level: Not on file  Occupational History  . Not on file  Tobacco Use  . Smoking status: Never Smoker  . Smokeless tobacco: Current User    Types: Chew  Vaping Use  . Vaping Use: Never used  Substance and Sexual Activity  . Alcohol use: Yes    Alcohol/week: 3.0 standard drinks    Types: 3 Cans of beer per week  . Drug use: Not on file  . Sexual activity: Yes    Partners: Female  Other Topics Concern  . Not on file  Social  History Narrative  . Not on file   Social Determinants of Health   Financial Resource Strain:   . Difficulty of Paying Living Expenses: Not on file  Food Insecurity:   . Worried About Programme researcher, broadcasting/film/video in the Last Year: Not on file  . Ran Out of Food in the Last Year: Not on file  Transportation Needs:   . Lack of Transportation (Medical): Not on file  . Lack of Transportation (Non-Medical): Not on file  Physical Activity:   . Days of Exercise per Week: Not on file  . Minutes of Exercise per Session:  Not on file  Stress:   . Feeling of Stress : Not on file  Social Connections:   . Frequency of Communication with Friends and Family: Not on file  . Frequency of Social Gatherings with Friends and Family: Not on file  . Attends Religious Services: Not on file  . Active Member of Clubs or Organizations: Not on file  . Attends Banker Meetings: Not on file  . Marital Status: Not on file  Intimate Partner Violence:   . Fear of Current or Ex-Partner: Not on file  . Emotionally Abused: Not on file  . Physically Abused: Not on file  . Sexually Abused: Not on file    No past surgical history on file.  Family History  Problem Relation Age of Onset  . High blood pressure Father   . High Cholesterol Father   . Benign prostatic hyperplasia Father   . Glaucoma Father     No Known Allergies  Current Outpatient Medications on File Prior to Visit  Medication Sig Dispense Refill  . amphetamine-dextroamphetamine (ADDERALL) 20 MG tablet Take 1 tablet (20 mg total) by mouth 2 (two) times daily. 60 tablet 0  . amphetamine-dextroamphetamine (ADDERALL) 20 MG tablet Take 1 tablet (20 mg total) by mouth 2 (two) times daily. 60 tablet 0  . amphetamine-dextroamphetamine (ADDERALL) 20 MG tablet Take 1 tablet (20 mg total) by mouth 2 (two) times daily. 60 tablet 0  . cetirizine (ZYRTEC) 10 MG tablet Take 10 mg by mouth daily.    . fluticasone (FLONASE) 50 MCG/ACT nasal spray SPRAY 2 SPRAYS INTO EACH NOSTRIL EVERY DAY 48 mL 3   No current facility-administered medications on file prior to visit.    BP 120/88   Pulse 65   Temp 98.4 F (36.9 C)   Ht 6' 0.05" (1.83 m)   Wt 246 lb 6.4 oz (111.8 kg)   SpO2 95%   BMI 33.37 kg/m       Objective:   Physical Exam Vitals and nursing note reviewed.  Constitutional:      General: He is not in acute distress.    Appearance: Normal appearance. He is well-developed and normal weight.  HENT:     Head: Normocephalic and atraumatic.      Right Ear: Ear canal and external ear normal. No drainage. A middle ear effusion is present. There is no impacted cerumen. Tympanic membrane is bulging. Tympanic membrane is not erythematous.     Left Ear: Ear canal and external ear normal. No drainage. A middle ear effusion is present. There is no impacted cerumen. Tympanic membrane is bulging. Tympanic membrane is not erythematous.     Nose: No congestion or rhinorrhea.     Right Turbinates: Enlarged and swollen.     Left Turbinates: Enlarged and swollen.     Right Sinus: Maxillary sinus tenderness and frontal sinus tenderness present.  Left Sinus: Maxillary sinus tenderness and frontal sinus tenderness present.     Mouth/Throat:     Mouth: Mucous membranes are moist.     Pharynx: Oropharynx is clear. No oropharyngeal exudate or posterior oropharyngeal erythema.  Eyes:     General:        Right eye: No discharge.        Left eye: No discharge.     Extraocular Movements: Extraocular movements intact.     Conjunctiva/sclera: Conjunctivae normal.     Pupils: Pupils are equal, round, and reactive to light.  Neck:     Vascular: No carotid bruit.     Trachea: No tracheal deviation.  Cardiovascular:     Rate and Rhythm: Normal rate and regular rhythm.     Pulses: Normal pulses.     Heart sounds: Normal heart sounds. No murmur heard.  No friction rub. No gallop.   Pulmonary:     Effort: Pulmonary effort is normal. No respiratory distress.     Breath sounds: Normal breath sounds. No stridor. No wheezing, rhonchi or rales.  Chest:     Chest wall: No tenderness.  Abdominal:     General: Bowel sounds are normal. There is no distension.     Palpations: Abdomen is soft. There is no mass.     Tenderness: There is no abdominal tenderness. There is no right CVA tenderness, left CVA tenderness, guarding or rebound.     Hernia: No hernia is present.  Musculoskeletal:        General: No swelling, tenderness, deformity or signs of injury. Normal  range of motion.     Right lower leg: No edema.     Left lower leg: No edema.  Lymphadenopathy:     Cervical: No cervical adenopathy.  Skin:    General: Skin is warm and dry.     Capillary Refill: Capillary refill takes less than 2 seconds.     Coloration: Skin is not jaundiced or pale.     Findings: No bruising, erythema, lesion or rash.  Neurological:     General: No focal deficit present.     Mental Status: He is alert and oriented to person, place, and time.     Cranial Nerves: No cranial nerve deficit.     Sensory: No sensory deficit.     Motor: No weakness.     Coordination: Coordination normal.     Gait: Gait normal.     Deep Tendon Reflexes: Reflexes normal.  Psychiatric:        Mood and Affect: Mood normal.        Behavior: Behavior normal.        Thought Content: Thought content normal.        Judgment: Judgment normal.       Assessment & Plan:  1. Routine general medical examination at a health care facility -Urged to continue with diet and exercise to help lose weight. -Follow-up in 1 year or sooner if needed - CBC with Differential/Platelet; Future - Lipid panel; Future - TSH; Future - Hemoglobin A1c; Future - Hemoglobin A1c - TSH - Lipid panel - CBC with Differential/Platelet  2. Attention deficit hyperactivity disorder (ADHD), predominantly inattentive type -He is good on Adderall until December.  Advised to follow-up in 6 months for follow-up exam regarding ADHD  3. Sinus congestion  - doxycycline (VIBRAMYCIN) 100 MG capsule; Take 1 capsule (100 mg total) by mouth 2 (two) times daily.  Dispense: 14 capsule; Refill: 0  4. Sleep disturbance -  will start him on Trazodone 25-50 mg to take PRN.  - traZODone (DESYREL) 50 MG tablet; Take 0.5-1 tablets (25-50 mg total) by mouth at bedtime as needed for sleep.  Dispense: 30 tablet; Refill: 3   Shirline Frees, NP

## 2020-01-05 LAB — LIPID PANEL
Cholesterol: 197 mg/dL (ref <200)
HDL: 54 mg/dL (ref >=40)
LDL Cholesterol (Calc): 111 mg/dL (calc) — ABNORMAL HIGH
Non-HDL Cholesterol (Calc): 143 mg/dL (calc) — ABNORMAL HIGH (ref <130)
Total CHOL/HDL Ratio: 3.6 (calc) (ref <5.0)
Triglycerides: 206 mg/dL — ABNORMAL HIGH (ref <150)

## 2020-01-05 LAB — CBC WITH DIFFERENTIAL/PLATELET
Absolute Monocytes: 408 cells/uL (ref 200–950)
Basophils Absolute: 48 cells/uL (ref 0–200)
Basophils Relative: 0.8 %
Eosinophils Absolute: 102 cells/uL (ref 15–500)
Eosinophils Relative: 1.7 %
HCT: 46.8 % (ref 38.5–50.0)
Hemoglobin: 15.9 g/dL (ref 13.2–17.1)
Lymphs Abs: 1716 cells/uL (ref 850–3900)
MCH: 32.8 pg (ref 27.0–33.0)
MCHC: 34 g/dL (ref 32.0–36.0)
MCV: 96.5 fL (ref 80.0–100.0)
MPV: 10.2 fL (ref 7.5–12.5)
Monocytes Relative: 6.8 %
Neutro Abs: 3726 cells/uL (ref 1500–7800)
Neutrophils Relative %: 62.1 %
Platelets: 264 10*3/uL (ref 140–400)
RBC: 4.85 10*6/uL (ref 4.20–5.80)
RDW: 12.2 % (ref 11.0–15.0)
Total Lymphocyte: 28.6 %
WBC: 6 10*3/uL (ref 3.8–10.8)

## 2020-01-05 LAB — HEMOGLOBIN A1C
Hgb A1c MFr Bld: 5.2 % of total Hgb (ref <5.7)
Mean Plasma Glucose: 103 (calc)
eAG (mmol/L): 5.7 (calc)

## 2020-01-05 LAB — TSH: TSH: 1.99 mIU/L (ref 0.40–4.50)

## 2020-01-26 ENCOUNTER — Other Ambulatory Visit: Payer: Self-pay | Admitting: Adult Health

## 2020-01-26 DIAGNOSIS — G479 Sleep disorder, unspecified: Secondary | ICD-10-CM

## 2020-03-17 DIAGNOSIS — U071 COVID-19: Secondary | ICD-10-CM | POA: Diagnosis not present

## 2020-03-18 ENCOUNTER — Encounter: Payer: Self-pay | Admitting: Adult Health

## 2020-03-18 ENCOUNTER — Telehealth (INDEPENDENT_AMBULATORY_CARE_PROVIDER_SITE_OTHER): Payer: BLUE CROSS/BLUE SHIELD | Admitting: Adult Health

## 2020-03-18 ENCOUNTER — Telehealth: Payer: Self-pay | Admitting: Adult Health

## 2020-03-18 DIAGNOSIS — U071 COVID-19: Secondary | ICD-10-CM

## 2020-03-18 NOTE — Telephone Encounter (Signed)
Scheduled the pt an appointment with Kandee Keen to discuss treatment.  Nothing further needed.

## 2020-03-18 NOTE — Telephone Encounter (Signed)
pt tested positive for covid want to know if he can have a medication called to his pharmacy  [phone] [id number] - [place], [place] - [address]  Phone:  [phone] Fax:  [phone]

## 2020-03-18 NOTE — Progress Notes (Signed)
Virtual Visit via Telephone Note  I connected with Walter Haney on 03/18/20 at  5:00 PM EST by telephone and verified that I am speaking with the correct person using two identifiers.   I discussed the limitations, risks, security and privacy concerns of performing an evaluation and management service by telephone and the availability of in person appointments. I also discussed with the patient that there may be a patient responsible charge related to this service. The patient expressed understanding and agreed to proceed.  Location patient: home Location provider: work or home office Participants present for the call: patient, provider Patient did not have a visit in the prior 7 days to address this/these issue(s).   History of Present Illness: 35 year old male who  has a past medical history of ADHD and Seasonal allergies.  Tested positive for COVID-19 yesterday. His symptoms started Sunday when he woke up and did not feel well. Had a fever up to 99.8, chills, eye sensitivity,nasal drainage, and body aches. His young son who is 62 months old as well as his wife both tested positive as well. Reports that he is doing well but the body aches are his biggest complaint.  He is not vaccinated    Observations/Objective: Patient sounds cheerful and well on the phone. I do not appreciate any SOB. Speech and thought processing are grossly intact. Patient reported vitals:  Assessment and Plan: 1. COVID-19 virus infection -Advised supportive care, rest, hydration, Tylenol/Motrin. Follow-up if symptoms worsen. Information given for retesting via code as employer requires a negative COVID before he can return to work   Follow Up Instructions:  I did not refer this patient for an OV in the next 24 hours for this/these issue(s).  I discussed the assessment and treatment plan with the patient. The patient was provided an opportunity to ask questions and all were answered. The patient  agreed with the plan and demonstrated an understanding of the instructions.   The patient was advised to call back or seek an in-person evaluation if the symptoms worsen or if the condition fails to improve as anticipated.  I provided 15 minutes of non-face-to-face time during this encounter.   Shirline Frees, NP

## 2020-03-21 ENCOUNTER — Other Ambulatory Visit: Payer: Self-pay | Admitting: Adult Health

## 2020-03-21 ENCOUNTER — Telehealth: Payer: Self-pay | Admitting: Adult Health

## 2020-03-21 DIAGNOSIS — U071 COVID-19: Secondary | ICD-10-CM

## 2020-03-21 MED ORDER — BENZONATATE 200 MG PO CAPS: 200.0000 mg | ORAL_CAPSULE | Freq: Two times a day (BID) | ORAL | 0 refills | Status: DC | PRN

## 2020-03-21 NOTE — Telephone Encounter (Addendum)
Patient is calling back and stated that his cough has gotten worse and wanted to see if there was something that can be called into the pharmacy, please advise. CB is [phone]

## 2020-03-21 NOTE — Telephone Encounter (Signed)
Tessalon perals sent in

## 2020-03-21 NOTE — Telephone Encounter (Signed)
Pt notified that rx has been sent to the pharmacy.  Nothing further needed. 

## 2020-03-22 ENCOUNTER — Other Ambulatory Visit: Payer: Self-pay

## 2020-03-22 DIAGNOSIS — Z20822 Contact with and (suspected) exposure to covid-19: Secondary | ICD-10-CM | POA: Diagnosis not present

## 2020-03-25 LAB — NOVEL CORONAVIRUS, NAA: SARS-CoV-2, NAA: DETECTED — AB

## 2020-05-09 ENCOUNTER — Telehealth: Payer: Self-pay | Admitting: Adult Health

## 2020-05-09 ENCOUNTER — Other Ambulatory Visit: Payer: Self-pay | Admitting: Adult Health

## 2020-05-09 DIAGNOSIS — F900 Attention-deficit hyperactivity disorder, predominantly inattentive type: Secondary | ICD-10-CM

## 2020-05-09 MED ORDER — AMPHETAMINE-DEXTROAMPHETAMINE 20 MG PO TABS: 20.0000 mg | ORAL_TABLET | Freq: Two times a day (BID) | ORAL | 0 refills | Status: DC

## 2020-05-09 NOTE — Telephone Encounter (Signed)
Last ov: 01/04/20 Last refill : 11-07-19

## 2020-05-09 NOTE — Telephone Encounter (Signed)
Patient is calling and requesting a refill for amphetamine-dextroamphetamine (ADDERALL) 20 MG tablet to be sent to [id number] - [place], [place] -  [address], [place] [address] Phone:  [phone] Fax:  [phone]  CB is [phone]

## 2021-01-06 ENCOUNTER — Other Ambulatory Visit: Payer: Self-pay

## 2021-01-07 ENCOUNTER — Encounter: Payer: Self-pay | Admitting: Adult Health

## 2021-01-07 ENCOUNTER — Ambulatory Visit (INDEPENDENT_AMBULATORY_CARE_PROVIDER_SITE_OTHER): Payer: BC Managed Care – PPO | Admitting: Adult Health

## 2021-01-07 VITALS — BP 110/82 | HR 60 | Temp 97.8°F | Ht 72.0 in | Wt 259.0 lb

## 2021-01-07 DIAGNOSIS — Z1159 Encounter for screening for other viral diseases: Secondary | ICD-10-CM | POA: Diagnosis not present

## 2021-01-07 DIAGNOSIS — J0140 Acute pansinusitis, unspecified: Secondary | ICD-10-CM | POA: Diagnosis not present

## 2021-01-07 DIAGNOSIS — Z0001 Encounter for general adult medical examination with abnormal findings: Secondary | ICD-10-CM | POA: Diagnosis not present

## 2021-01-07 DIAGNOSIS — F900 Attention-deficit hyperactivity disorder, predominantly inattentive type: Secondary | ICD-10-CM

## 2021-01-07 DIAGNOSIS — Z0000 Encounter for general adult medical examination without abnormal findings: Secondary | ICD-10-CM

## 2021-01-07 DIAGNOSIS — Z114 Encounter for screening for human immunodeficiency virus [HIV]: Secondary | ICD-10-CM

## 2021-01-07 LAB — CBC WITH DIFFERENTIAL/PLATELET
Basophils Absolute: 0 10*3/uL (ref 0.0–0.1)
Basophils Relative: 0.5 % (ref 0.0–3.0)
Eosinophils Absolute: 0 10*3/uL (ref 0.0–0.7)
Eosinophils Relative: 0.9 % (ref 0.0–5.0)
HCT: 43.8 % (ref 39.0–52.0)
Hemoglobin: 14.9 g/dL (ref 13.0–17.0)
Lymphocytes Relative: 37.4 % (ref 12.0–46.0)
Lymphs Abs: 1.7 10*3/uL (ref 0.7–4.0)
MCHC: 34.1 g/dL (ref 30.0–36.0)
MCV: 95.2 fl (ref 78.0–100.0)
Monocytes Absolute: 0.3 10*3/uL (ref 0.1–1.0)
Monocytes Relative: 6.1 % (ref 3.0–12.0)
Neutro Abs: 2.5 10*3/uL (ref 1.4–7.7)
Neutrophils Relative %: 55.1 % (ref 43.0–77.0)
Platelets: 224 10*3/uL (ref 150.0–400.0)
RBC: 4.6 Mil/uL (ref 4.22–5.81)
RDW: 12.9 % (ref 11.5–15.5)
WBC: 4.6 10*3/uL (ref 4.0–10.5)

## 2021-01-07 LAB — COMPREHENSIVE METABOLIC PANEL
ALT: 30 U/L (ref 0–53)
AST: 20 U/L (ref 0–37)
Albumin: 4.7 g/dL (ref 3.5–5.2)
Alkaline Phosphatase: 42 U/L (ref 39–117)
BUN: 13 mg/dL (ref 6–23)
CO2: 29 mEq/L (ref 19–32)
Calcium: 9.2 mg/dL (ref 8.4–10.5)
Chloride: 103 mEq/L (ref 96–112)
Creatinine, Ser: 0.91 mg/dL (ref 0.40–1.50)
GFR: 109.46 mL/min (ref >60.00)
Glucose, Bld: 96 mg/dL (ref 70–99)
Potassium: 4 mEq/L (ref 3.5–5.1)
Sodium: 140 mEq/L (ref 135–145)
Total Bilirubin: 0.7 mg/dL (ref 0.2–1.2)
Total Protein: 7.1 g/dL (ref 6.0–8.3)

## 2021-01-07 LAB — HIV ANTIBODY (ROUTINE TESTING W REFLEX): HIV 1&2 Ab, 4th Generation: NONREACTIVE

## 2021-01-07 LAB — LIPID PANEL
Cholesterol: 187 mg/dL (ref 0–200)
HDL: 54.6 mg/dL (ref >39.00)
LDL Cholesterol: 108 mg/dL — ABNORMAL HIGH (ref 0–99)
NonHDL: 132.08
Total CHOL/HDL Ratio: 3
Triglycerides: 121 mg/dL (ref 0.0–149.0)
VLDL: 24.2 mg/dL (ref 0.0–40.0)

## 2021-01-07 LAB — HEPATITIS C ANTIBODY
Hepatitis C Ab: NONREACTIVE
SIGNAL TO CUT-OFF: 0.05 (ref <1.00)

## 2021-01-07 LAB — TSH: TSH: 1.79 u[IU]/mL (ref 0.35–5.50)

## 2021-01-07 MED ORDER — FLUTICASONE PROPIONATE 50 MCG/ACT NA SUSP: NASAL | 3 refills | Status: AC

## 2021-01-07 MED ORDER — AMPHETAMINE-DEXTROAMPHETAMINE 20 MG PO TABS: 20.0000 mg | ORAL_TABLET | Freq: Two times a day (BID) | ORAL | 0 refills | Status: DC

## 2021-01-07 MED ORDER — DOXYCYCLINE HYCLATE 100 MG PO CAPS: 100.0000 mg | ORAL_CAPSULE | Freq: Two times a day (BID) | ORAL | 0 refills | Status: DC

## 2021-01-07 MED ORDER — AMPHETAMINE-DEXTROAMPHETAMINE 20 MG PO TABS
20.0000 mg | ORAL_TABLET | Freq: Two times a day (BID) | ORAL | 0 refills | Status: DC
Start: 2021-01-07 — End: 2021-06-25

## 2021-01-07 NOTE — Progress Notes (Signed)
Subjective:    Patient ID: Walter Haney, male    DOB: December 23, 1985, 35 y.o.   MRN: [id number]  HPI Patient presents for yearly preventative medicine examination. He is a pleasant 35 year old male who  has a past medical history of ADHD and Seasonal allergies.  ADHD -prescribed Adderall 20 mg twice daily.  He feels as this dose controls his symptoms well.  Denies side effects such as insomnia or anorexia  Sinus Infection - symptoms have been present for 1 week. Seem to be getting worse. Reports sinus pain and pressure, rhinorrhea with green drainage, fatigue. Denies fevers or chills.   All immunizations and health maintenance protocols were reviewed with the patient and needed orders were placed. Refused flu shot  Appropriate screening laboratory values were ordered for the patient including screening of hyperlipidemia, renal function and hepatic function.   Medication reconciliation,  past medical history, social history, problem list and allergies were reviewed in detail with the patient  Goals were established with regard to weight loss, exercise, and  diet in compliance with medications Wt Readings from Last 3 Encounters:  01/04/20 246 lb 6.4 oz (111.8 kg)  11/15/18 252 lb (114.3 kg)  05/18/18 247 lb (112 kg)    Review of Systems  Constitutional:  Positive for fatigue.  HENT:  Positive for rhinorrhea and sinus pressure.   Eyes: Negative.   Respiratory: Negative.    Cardiovascular: Negative.   Gastrointestinal: Negative.   Endocrine: Negative.   Genitourinary: Negative.   Musculoskeletal: Negative.   Skin: Negative.   Allergic/Immunologic: Negative.   Neurological: Negative.   Hematological: Negative.   Psychiatric/Behavioral: Negative.    All other systems reviewed and are negative.  Past Medical History:  Diagnosis Date   ADHD    Seasonal allergies     Social History   Socioeconomic History   Marital status: Single    Spouse name: Not on file   Number  of children: Not on file   Years of education: Not on file   Highest education level: Not on file  Occupational History   Not on file  Tobacco Use   Smoking status: Never   Smokeless tobacco: Current    Types: Chew  Vaping Use   Vaping Use: Never used  Substance and Sexual Activity   Alcohol use: Yes    Alcohol/week: 3.0 standard drinks    Types: 3 Cans of beer per week   Drug use: Not on file   Sexual activity: Yes    Partners: Female  Other Topics Concern   Not on file  Social History Narrative   Not on file   Social Determinants of Health   Financial Resource Strain: Not on file  Food Insecurity: Not on file  Transportation Needs: Not on file  Physical Activity: Not on file  Stress: Not on file  Social Connections: Not on file  Intimate Partner Violence: Not on file    No past surgical history on file.  Family History  Problem Relation Age of Onset   High blood pressure Father    High Cholesterol Father    Benign prostatic hyperplasia Father    Glaucoma Father     No Known Allergies  Current Outpatient Medications on File Prior to Visit  Medication Sig Dispense Refill   amphetamine-dextroamphetamine (ADDERALL) 20 MG tablet Take 1 tablet (20 mg total) by mouth 2 (two) times daily. 60 tablet 0   amphetamine-dextroamphetamine (ADDERALL) 20 MG tablet Take 1 tablet (20 mg  total) by mouth 2 (two) times daily. 60 tablet 0   amphetamine-dextroamphetamine (ADDERALL) 20 MG tablet Take 1 tablet (20 mg total) by mouth 2 (two) times daily. 60 tablet 0   benzonatate (TESSALON) 200 MG capsule Take 1 capsule (200 mg total) by mouth 2 (two) times daily as needed for cough. 20 capsule 0   cetirizine (ZYRTEC) 10 MG tablet Take 10 mg by mouth daily.     doxycycline (VIBRAMYCIN) 100 MG capsule Take 1 capsule (100 mg total) by mouth 2 (two) times daily. 14 capsule 0   fluticasone (FLONASE) 50 MCG/ACT nasal spray SPRAY 2 SPRAYS INTO EACH NOSTRIL EVERY DAY 48 mL 3   traZODone  (DESYREL) 50 MG tablet TAKE 0.5-1 TABLET BY MOUTH AT BEDTIME AS NEEDED FOR SLEEP. 90 tablet 2   No current facility-administered medications on file prior to visit.    There were no vitals taken for this visit.       Objective:   Physical Exam Vitals and nursing note reviewed.  Constitutional:      General: He is not in acute distress.    Appearance: Normal appearance. He is well-developed and normal weight.  HENT:     Head: Normocephalic and atraumatic.     Right Ear: Tympanic membrane, ear canal and external ear normal. There is no impacted cerumen.     Left Ear: Tympanic membrane, ear canal and external ear normal. There is no impacted cerumen.     Nose: Congestion and rhinorrhea present. Rhinorrhea is purulent.     Right Turbinates: Enlarged and swollen.     Left Turbinates: Enlarged and swollen.     Right Sinus: Maxillary sinus tenderness and frontal sinus tenderness present.     Left Sinus: Maxillary sinus tenderness and frontal sinus tenderness present.     Mouth/Throat:     Mouth: Mucous membranes are moist.     Pharynx: Oropharynx is clear. No oropharyngeal exudate or posterior oropharyngeal erythema.  Eyes:     General:        Right eye: No discharge.        Left eye: No discharge.     Extraocular Movements: Extraocular movements intact.     Conjunctiva/sclera: Conjunctivae normal.     Pupils: Pupils are equal, round, and reactive to light.  Neck:     Vascular: No carotid bruit.     Trachea: No tracheal deviation.  Cardiovascular:     Rate and Rhythm: Normal rate and regular rhythm.     Pulses: Normal pulses.     Heart sounds: Normal heart sounds. No murmur heard.   No friction rub. No gallop.  Pulmonary:     Effort: Pulmonary effort is normal. No respiratory distress.     Breath sounds: Normal breath sounds. No stridor. No wheezing, rhonchi or rales.  Chest:     Chest wall: No tenderness.  Abdominal:     General: Bowel sounds are normal. There is no  distension.     Palpations: Abdomen is soft. There is no mass.     Tenderness: There is no abdominal tenderness. There is no right CVA tenderness, left CVA tenderness, guarding or rebound.     Hernia: No hernia is present.  Musculoskeletal:        General: No swelling, tenderness, deformity or signs of injury. Normal range of motion.     Right lower leg: No edema.     Left lower leg: No edema.  Lymphadenopathy:     Cervical: No cervical adenopathy.  Skin:    General: Skin is warm and dry.     Capillary Refill: Capillary refill takes less than 2 seconds.     Coloration: Skin is not jaundiced or pale.     Findings: No bruising, erythema, lesion or rash.  Neurological:     General: No focal deficit present.     Mental Status: He is alert and oriented to person, place, and time.     Cranial Nerves: No cranial nerve deficit.     Sensory: No sensory deficit.     Motor: No weakness.     Coordination: Coordination normal.     Gait: Gait normal.     Deep Tendon Reflexes: Reflexes normal.  Psychiatric:        Mood and Affect: Mood normal.        Behavior: Behavior normal.        Thought Content: Thought content normal.        Judgment: Judgment normal.      Assessment & Plan:  1. Routine general medical examination at a health care facility - Work on lifestyle modifications for weight loss  - Follow up in 1 year  - CBC with Differential/Platelet; Future - Comprehensive metabolic panel; Future - Lipid panel; Future - TSH; Future - TSH - Lipid panel - Comprehensive metabolic panel - CBC with Differential/Platelet  2. Attention deficit hyperactivity disorder (ADHD), predominantly inattentive type  - amphetamine-dextroamphetamine (ADDERALL) 20 MG tablet; Take 1 tablet (20 mg total) by mouth 2 (two) times daily.  Dispense: 60 tablet; Refill: 0 - amphetamine-dextroamphetamine (ADDERALL) 20 MG tablet; Take 1 tablet (20 mg total) by mouth 2 (two) times daily.  Dispense: 60 tablet;  Refill: 0 - amphetamine-dextroamphetamine (ADDERALL) 20 MG tablet; Take 1 tablet (20 mg total) by mouth 2 (two) times daily.  Dispense: 60 tablet; Refill: 0 - Six month follow up   3. Encounter for screening for HIV  - HIV Antibody (routine testing w rflx); Future - HIV Antibody (routine testing w rflx)  4. Need for hepatitis C screening test  - Hep C Antibody; Future - Hep C Antibody  5. Acute pansinusitis, recurrence not specified  - fluticasone (FLONASE) 50 MCG/ACT nasal spray; SPRAY 2 SPRAYS INTO EACH NOSTRIL EVERY DAY  Dispense: 48 mL; Refill: 3 - doxycycline (VIBRAMYCIN) 100 MG capsule; Take 1 capsule (100 mg total) by mouth 2 (two) times daily.  Dispense: 14 capsule; Refill: 0

## 2021-01-27 ENCOUNTER — Telehealth: Payer: Self-pay

## 2021-01-27 NOTE — Telephone Encounter (Signed)
Patient called requesting a call back to discuss lab results.

## 2021-01-28 NOTE — Telephone Encounter (Signed)
Patient notified of update  and verbalized understanding. 

## 2021-06-19 ENCOUNTER — Encounter: Payer: Self-pay | Admitting: Family Medicine

## 2021-06-19 ENCOUNTER — Telehealth: Payer: Self-pay | Admitting: Adult Health

## 2021-06-19 ENCOUNTER — Telehealth (INDEPENDENT_AMBULATORY_CARE_PROVIDER_SITE_OTHER): Payer: BC Managed Care – PPO | Admitting: Family Medicine

## 2021-06-19 VITALS — Ht 72.0 in | Wt 259.0 lb

## 2021-06-19 DIAGNOSIS — R0981 Nasal congestion: Secondary | ICD-10-CM

## 2021-06-19 DIAGNOSIS — R519 Headache, unspecified: Secondary | ICD-10-CM | POA: Diagnosis not present

## 2021-06-19 MED ORDER — BENZONATATE 100 MG PO CAPS: ORAL_CAPSULE | ORAL | 0 refills | Status: DC

## 2021-06-19 MED ORDER — DOXYCYCLINE HYCLATE 100 MG PO TABS: 100.0000 mg | ORAL_TABLET | Freq: Two times a day (BID) | ORAL | 0 refills | Status: DC

## 2021-06-19 NOTE — Progress Notes (Signed)
Virtual Visit via Video Note ? ?I connected with Walter Haney ? on 06/19/21 at 10:40 AM EDT by a video enabled telemedicine application and verified that I am speaking with the correct person using two identifiers. ? Location patient: [facility] ?Location provider:work or home office ?Persons participating in the virtual visit: patient, provider ? ?I discussed the limitations and requested verbal permission for telemedicine visit. The patient expressed understanding and agreed to proceed. ? ? ?HPI: ? ?Acute telemedicine visit for sinus issues: ?-Onset: 2+ weeks ago ?-Symptoms include: sinus issues, cough, was improving a little, but then worsened the last few days, thick mucus, occ sob,  hacking cough, facial pain maxillary sinus region ?-home test neg for covid ?-Denies: CP, SOB, NVD, fevers, severe/worst headache ?-Has tried: zyrtec,OTC cough and cold medications ?-Pertinent past medical history: see below, hx of sinus infections and AR ?-Pertinent medication allergies:No Known Allergies ?-COVID-19 vaccine status:  ?Immunization History  ?Administered Date(s) Administered  ? Influenza,inj,Quad PF,6+ Mos 11/15/2018  ? Pneumococcal Polysaccharide-23 01/19/2017  ? Tdap 06/23/2013  ? ? ? ?ROS: See pertinent positives and negatives per HPI. ? ?Past Medical History:  ?Diagnosis Date  ? ADHD   ? Seasonal allergies   ? ? ?History reviewed. No pertinent surgical history. ? ? ?Current Outpatient Medications:  ?  amphetamine-dextroamphetamine (ADDERALL) 20 MG tablet, Take 1 tablet (20 mg total) by mouth 2 (two) times daily., Disp: 60 tablet, Rfl: 0 ?  amphetamine-dextroamphetamine (ADDERALL) 20 MG tablet, Take 1 tablet (20 mg total) by mouth 2 (two) times daily., Disp: 60 tablet, Rfl: 0 ?  amphetamine-dextroamphetamine (ADDERALL) 20 MG tablet, Take 1 tablet (20 mg total) by mouth 2 (two) times daily., Disp: 60 tablet, Rfl: 0 ?  benzonatate (TESSALON PERLES) 100 MG capsule, 1-3 capsules up to twice daily as needed for cough, Disp: 30  capsule, Rfl: 0 ?  cetirizine (ZYRTEC) 10 MG tablet, Take 10 mg by mouth daily., Disp: , Rfl:  ?  doxycycline (VIBRA-TABS) 100 MG tablet, Take 1 tablet (100 mg total) by mouth 2 (two) times daily., Disp: 20 tablet, Rfl: 0 ?  fluticasone (FLONASE) 50 MCG/ACT nasal spray, SPRAY 2 SPRAYS INTO EACH NOSTRIL EVERY DAY, Disp: 48 mL, Rfl: 3 ? ?EXAM: ? ?VITALS per patient if applicable: ? ?GENERAL: alert, oriented, appears well and in no acute distress ? ?HEENT: atraumatic, conjunttiva clear, no obvious abnormalities on inspection of external nose and ears ? ?NECK: normal movements of the head and neck ? ?LUNGS: on inspection no signs of respiratory distress, breathing rate appears normal, no obvious gross SOB, gasping or wheezing ? ?CV: no obvious cyanosis ? ?MS: moves all visible extremities without noticeable abnormality ? ?PSYCH/NEURO: pleasant and cooperative, no obvious depression or anxiety, speech and thought processing grossly intact ? ?ASSESSMENT AND PLAN: ? ?Discussed the following assessment and plan: ? ?Facial discomfort ? ?Nasal congestion ? ?-we discussed possible serious and likely etiologies, options for evaluation and workup, limitations of telemedicine visit vs in person visit, treatment, treatment risks and precautions. Pt is agreeable to treatment via telemedicine at this moment. Query 2nday bacterial sinusitis vs other. He prefers to try empiric tx with abx and prefers doxy. Discussed risks and appropriate risks and alternatives. Also, sent Tessalon for cough.  ?Advised to seek prompt virtual visit or in person care if worsening, new symptoms arise, or if is not improving with treatment as expected per our conversation of expected course. Discussed options for follow up care. Did let this patient know that I do telemedicine on Tuesdays  and Thursdays for [facility] and those are the days I am logged into the system. Advised to schedule follow up visit with PCP, [facility] virtual visits or [facility] if any  further questions or concerns to avoid delays in care. ?  ?I discussed the assessment and treatment plan with the patient. The patient was provided an opportunity to ask questions and all were answered. The patient agreed with the plan and demonstrated an understanding of the instructions. ?  ? ? ?Terressa Koyanagi, DO  ? ?

## 2021-06-19 NOTE — Telephone Encounter (Signed)
Pt has been schedule for a VV.  ?

## 2021-06-19 NOTE — Telephone Encounter (Signed)
Pt called in with c/o excessive mucus, congestion x 1 week. Requesting prescription for antibiotic. Offered to schedule appointment, pt declined and requested that dr Myriam Forehand nurse give him a call.  ?

## 2021-06-19 NOTE — Patient Instructions (Signed)
-  I sent the medication(s) we discussed to your pharmacy: ?Meds ordered this encounter  ?Medications  ? benzonatate (TESSALON PERLES) 100 MG capsule  ?  Sig: 1-3 capsules up to twice daily as needed for cough  ?  Dispense:  30 capsule  ?  Refill:  0  ? doxycycline (VIBRA-TABS) 100 MG tablet  ?  Sig: Take 1 tablet (100 mg total) by mouth 2 (two) times daily.  ?  Dispense:  20 tablet  ?  Refill:  0  ? ? ? ?I hope you are feeling better soon! ? ?Seek in person care promptly if your symptoms worsen, new concerns arise or you are not improving with treatment. ? ?It was nice to meet you today. I help [facility] out with telemedicine visits on Tuesdays and Thursdays and am happy to help if you need a virtual follow up visit on those days. Otherwise, if you have any concerns or questions following this visit please schedule a follow up visit with your Primary Care office or seek care at a local urgent care clinic to avoid delays in care ? ?

## 2021-06-25 ENCOUNTER — Ambulatory Visit (INDEPENDENT_AMBULATORY_CARE_PROVIDER_SITE_OTHER): Payer: BC Managed Care – PPO | Admitting: Adult Health

## 2021-06-25 ENCOUNTER — Encounter: Payer: Self-pay | Admitting: Adult Health

## 2021-06-25 VITALS — BP 110/60 | HR 60 | Temp 98.4°F | Ht 72.0 in | Wt 258.0 lb

## 2021-06-25 DIAGNOSIS — F900 Attention-deficit hyperactivity disorder, predominantly inattentive type: Secondary | ICD-10-CM | POA: Diagnosis not present

## 2021-06-25 DIAGNOSIS — J0140 Acute pansinusitis, unspecified: Secondary | ICD-10-CM | POA: Diagnosis not present

## 2021-06-25 MED ORDER — AMPHETAMINE-DEXTROAMPHETAMINE 20 MG PO TABS: 20.0000 mg | ORAL_TABLET | Freq: Two times a day (BID) | ORAL | 0 refills | Status: DC

## 2021-06-25 MED ORDER — PREDNISONE 10 MG PO TABS: ORAL_TABLET | ORAL | 0 refills | Status: DC

## 2021-06-25 NOTE — Progress Notes (Signed)
? ?Subjective:  ? ? Patient ID: Walter Haney, male    DOB: 1985/12/05, 36 y.o.   MRN: [id number] ? ?HPI ?36 year old male who  has a past medical history of ADHD and Seasonal allergies. ? ?He presents to the office today for an acute and chronic issue.  ? ?Acute Issue  ?He was seen 7 days ago via virtual visit with another provider for 2+ weeks of sinus pain/pressure, productive cough with thick mucus, and occasional shortness of breath.  He was prescribed doxycycline 100 mg twice daily x10 days.  He does report that since starting the antibiotics he feels about 70% better continues to have a hacking cough with thick mucus and some sinus discomfort.  Has not had any fevers or chills. ? ? ?Chronic issue ?-ADHD management-prescribed Adderall 20 mg twice daily.  He does feel very well controlled on this medication.  Denies insomnia or anorexia.  He is in need of a refill. ? ? ?Review of Systems ?See HPI  ? ?Past Medical History:  ?Diagnosis Date  ? ADHD   ? Seasonal allergies   ? ? ?Social History  ? ?Socioeconomic History  ? Marital status: Single  ?  Spouse name: Not on file  ? Number of children: Not on file  ? Years of education: Not on file  ? Highest education level: Not on file  ?Occupational History  ? Not on file  ?Tobacco Use  ? Smoking status: Never  ? Smokeless tobacco: Current  ?  Types: Chew  ?Vaping Use  ? Vaping Use: Never used  ?Substance and Sexual Activity  ? Alcohol use: Yes  ?  Alcohol/week: 3.0 standard drinks  ?  Types: 3 Cans of beer per week  ? Drug use: Not on file  ? Sexual activity: Yes  ?  Partners: Female  ?Other Topics Concern  ? Not on file  ?Social History Narrative  ? Not on file  ? ?Social Determinants of Health  ? ?Financial Resource Strain: Not on file  ?Food Insecurity: Not on file  ?Transportation Needs: Not on file  ?Physical Activity: Not on file  ?Stress: Not on file  ?Social Connections: Not on file  ?Intimate Partner Violence: Not on file  ? ? ?No past surgical history  on file. ? ?Family History  ?Problem Relation Age of Onset  ? High blood pressure Father   ? High Cholesterol Father   ? Benign prostatic hyperplasia Father   ? Glaucoma Father   ? ? ?No Known Allergies ? ?Current Outpatient Medications on File Prior to Visit  ?Medication Sig Dispense Refill  ? benzonatate (TESSALON PERLES) 100 MG capsule 1-3 capsules up to twice daily as needed for cough 30 capsule 0  ? cetirizine (ZYRTEC) 10 MG tablet Take 10 mg by mouth daily.    ? doxycycline (VIBRA-TABS) 100 MG tablet Take 1 tablet (100 mg total) by mouth 2 (two) times daily. 20 tablet 0  ? fluticasone (FLONASE) 50 MCG/ACT nasal spray SPRAY 2 SPRAYS INTO EACH NOSTRIL EVERY DAY 48 mL 3  ? ?No current facility-administered medications on file prior to visit.  ? ? ?BP 110/60   Pulse 60   Temp 98.4 ?F (36.9 ?C) (Oral)   Ht 6' (1.829 m)   Wt 258 lb (117 kg)   SpO2 96%   BMI 34.99 kg/m?  ? ? ?   ?Objective:  ? Physical Exam ?Vitals and nursing note reviewed.  ?Constitutional:   ?   Appearance: Normal  appearance.  ?HENT:  ?   Right Ear: Tympanic membrane, ear canal and external ear normal. There is no impacted cerumen.  ?   Left Ear: Tympanic membrane, ear canal and external ear normal. There is no impacted cerumen.  ?   Nose: Congestion present. No rhinorrhea.  ?   Right Turbinates: Enlarged and swollen.  ?   Left Turbinates: Enlarged and swollen.  ?   Mouth/Throat:  ?   Mouth: Mucous membranes are moist.  ?Skin: ?   General: Skin is warm and dry.  ?Neurological:  ?   General: No focal deficit present.  ?   Mental Status: He is alert and oriented to person, place, and time.  ?Psychiatric:     ?   Mood and Affect: Mood normal.     ?   Behavior: Behavior normal.     ?   Thought Content: Thought content normal.     ?   Judgment: Judgment normal.  ? ? ?   ?Assessment & Plan:  ?1. Attention deficit hyperactivity disorder (ADHD), predominantly inattentive type ? ?- amphetamine-dextroamphetamine (ADDERALL) 20 MG tablet; Take 1 tablet  (20 mg total) by mouth 2 (two) times daily.  Dispense: 60 tablet; Refill: 0 ?- amphetamine-dextroamphetamine (ADDERALL) 20 MG tablet; Take 1 tablet (20 mg total) by mouth 2 (two) times daily.  Dispense: 60 tablet; Refill: 0 ?- amphetamine-dextroamphetamine (ADDERALL) 20 MG tablet; Take 1 tablet (20 mg total) by mouth 2 (two) times daily.  Dispense: 60 tablet; Refill: 0 ? ?2. Acute non-recurrent pansinusitis ?- Finished abx. Will add on prednisone to help with inflammation.  ?- Follow up if not resolved in the next 4-5 days  ?- predniSONE (DELTASONE) 10 MG tablet; 40 mg x 3 days, 20 mg x 3 days, 10 mg x 3 days  Dispense: 21 tablet; Refill: 0 ? ?Shirline Frees, NP ? ?

## 2021-09-30 DIAGNOSIS — M5417 Radiculopathy, lumbosacral region: Secondary | ICD-10-CM | POA: Diagnosis not present

## 2021-09-30 DIAGNOSIS — M9904 Segmental and somatic dysfunction of sacral region: Secondary | ICD-10-CM | POA: Diagnosis not present

## 2021-09-30 DIAGNOSIS — M9902 Segmental and somatic dysfunction of thoracic region: Secondary | ICD-10-CM | POA: Diagnosis not present

## 2021-09-30 DIAGNOSIS — M9903 Segmental and somatic dysfunction of lumbar region: Secondary | ICD-10-CM | POA: Diagnosis not present

## 2021-10-02 DIAGNOSIS — M5417 Radiculopathy, lumbosacral region: Secondary | ICD-10-CM | POA: Diagnosis not present

## 2021-10-02 DIAGNOSIS — M9903 Segmental and somatic dysfunction of lumbar region: Secondary | ICD-10-CM | POA: Diagnosis not present

## 2021-10-02 DIAGNOSIS — M9904 Segmental and somatic dysfunction of sacral region: Secondary | ICD-10-CM | POA: Diagnosis not present

## 2021-10-02 DIAGNOSIS — M9902 Segmental and somatic dysfunction of thoracic region: Secondary | ICD-10-CM | POA: Diagnosis not present

## 2021-10-06 DIAGNOSIS — M9904 Segmental and somatic dysfunction of sacral region: Secondary | ICD-10-CM | POA: Diagnosis not present

## 2021-10-06 DIAGNOSIS — M9903 Segmental and somatic dysfunction of lumbar region: Secondary | ICD-10-CM | POA: Diagnosis not present

## 2021-10-06 DIAGNOSIS — M9902 Segmental and somatic dysfunction of thoracic region: Secondary | ICD-10-CM | POA: Diagnosis not present

## 2021-10-06 DIAGNOSIS — M5417 Radiculopathy, lumbosacral region: Secondary | ICD-10-CM | POA: Diagnosis not present

## 2021-10-08 DIAGNOSIS — M9904 Segmental and somatic dysfunction of sacral region: Secondary | ICD-10-CM | POA: Diagnosis not present

## 2021-10-08 DIAGNOSIS — M9903 Segmental and somatic dysfunction of lumbar region: Secondary | ICD-10-CM | POA: Diagnosis not present

## 2021-10-08 DIAGNOSIS — M9902 Segmental and somatic dysfunction of thoracic region: Secondary | ICD-10-CM | POA: Diagnosis not present

## 2021-10-08 DIAGNOSIS — M5417 Radiculopathy, lumbosacral region: Secondary | ICD-10-CM | POA: Diagnosis not present

## 2021-11-11 ENCOUNTER — Other Ambulatory Visit: Payer: Self-pay | Admitting: Adult Health

## 2021-11-11 ENCOUNTER — Telehealth: Payer: Self-pay | Admitting: Adult Health

## 2021-11-11 DIAGNOSIS — F900 Attention-deficit hyperactivity disorder, predominantly inattentive type: Secondary | ICD-10-CM

## 2021-11-11 MED ORDER — AMPHETAMINE-DEXTROAMPHETAMINE 20 MG PO TABS: 20.0000 mg | ORAL_TABLET | Freq: Two times a day (BID) | ORAL | 0 refills | Status: DC

## 2021-11-11 NOTE — Telephone Encounter (Signed)
Pt is calling and would like a refill on amphetamine-dextroamphetamine (ADDERALL) 20 MG tablet  [id number] - [place], [place] - [address] Phone:  [phone]  Fax:  [phone]

## 2021-11-11 NOTE — Telephone Encounter (Signed)
Last OV 06/25/21 Next OV: none scheduled

## 2021-12-21 ENCOUNTER — Encounter: Payer: Self-pay | Admitting: Physician Assistant

## 2021-12-21 ENCOUNTER — Ambulatory Visit (INDEPENDENT_AMBULATORY_CARE_PROVIDER_SITE_OTHER): Payer: BC Managed Care – PPO | Admitting: Physician Assistant

## 2021-12-21 VITALS — BP 122/80 | HR 81 | Temp 98.0°F | Ht 72.0 in | Wt 255.2 lb

## 2021-12-21 DIAGNOSIS — J029 Acute pharyngitis, unspecified: Secondary | ICD-10-CM

## 2021-12-21 LAB — POCT RAPID STREP A (OFFICE): Rapid Strep A Screen: POSITIVE — AB

## 2021-12-21 LAB — POC INFLUENZA A&B (BINAX/QUICKVUE)
Influenza A, POC: NEGATIVE
Influenza B, POC: NEGATIVE

## 2021-12-21 LAB — POC COVID19 BINAXNOW: SARS Coronavirus 2 Ag: NEGATIVE

## 2021-12-21 MED ORDER — AMOXICILLIN-POT CLAVULANATE 875-125 MG PO TABS: 1.0000 | ORAL_TABLET | Freq: Two times a day (BID) | ORAL | 0 refills | Status: DC

## 2021-12-21 NOTE — Patient Instructions (Signed)
It was great to see you!  Flu and COVID negative  Strep has faint positive -- start the antibiotic for 10 days to cover for strep and sinus infection   Take care,  Inda Coke PA-C

## 2021-12-21 NOTE — Progress Notes (Signed)
Walter Haney is a 36 y.o. male here for a follow up of a pre-existing problem.  History of Present Illness:   Chief Complaint  Patient presents with   Sinus Problem    Pt c/o nasal & chest congestion, cough, headache, chills, body aches, no fever.    Sinus Patient is complaining of congestion, cough, and headaches that started 2 weeks ago, took Copywriter, advertising but had worsening symptoms. Friday of last week he begins to experience body aches, chills, and feeling hot. He reports to take Alka Seltzer and Tylenol to relieve symptoms. He believes he caught an illness from his 41 year old son who has a cough and goes to daycare, sleeps in his bed. 58 month old also now has a cough. He denies fever but confirms some SOB. He denies ear pain, but states that they feel clogged.   Denies: chest pain, LE swelling, tobacco smoke use  Past Medical History:  Diagnosis Date   ADHD    Seasonal allergies      Social History   Tobacco Use   Smoking status: Never   Smokeless tobacco: Current    Types: Chew  Vaping Use   Vaping Use: Never used  Substance Use Topics   Alcohol use: Yes    Alcohol/week: 3.0 standard drinks of alcohol    Types: 3 Cans of beer per week    History reviewed. No pertinent surgical history.  Family History  Problem Relation Age of Onset   High blood pressure Father    High Cholesterol Father    Benign prostatic hyperplasia Father    Glaucoma Father     No Known Allergies  Current Medications:   Current Outpatient Medications:    amphetamine-dextroamphetamine (ADDERALL) 20 MG tablet, Take 1 tablet (20 mg total) by mouth 2 (two) times daily., Disp: 60 tablet, Rfl: 0   amphetamine-dextroamphetamine (ADDERALL) 20 MG tablet, Take 1 tablet (20 mg total) by mouth 2 (two) times daily., Disp: 60 tablet, Rfl: 0   amphetamine-dextroamphetamine (ADDERALL) 20 MG tablet, Take 1 tablet (20 mg total) by mouth 2 (two) times daily., Disp: 60 tablet, Rfl: 0   cetirizine  (ZYRTEC) 10 MG tablet, Take 10 mg by mouth daily., Disp: , Rfl:    fluticasone (FLONASE) 50 MCG/ACT nasal spray, SPRAY 2 SPRAYS INTO EACH NOSTRIL EVERY DAY, Disp: 48 mL, Rfl: 3   Review of Systems:   Review of Systems  Constitutional:  Positive for chills. Negative for fever.       (+) body aches  HENT:  Positive for congestion. Negative for ear pain.   Respiratory:  Positive for cough and shortness of breath.   Neurological:  Positive for headaches.    Vitals:   Vitals:   12/21/21 1414  BP: 122/80  Pulse: 81  Temp: 98 F (36.7 C)  TempSrc: Temporal  SpO2: 96%  Weight: 255 lb 4 oz (115.8 kg)  Height: 6' (1.829 m)     Body mass index is 34.62 kg/m.  Physical Exam:   Physical Exam Constitutional:      General: He is not in acute distress.    Appearance: Normal appearance. He is not ill-appearing.  HENT:     Head: Normocephalic and atraumatic.     Right Ear: External ear normal.     Left Ear: External ear normal. Tympanic membrane is erythematous.  Eyes:     Extraocular Movements: Extraocular movements intact.     Pupils: Pupils are equal, round, and reactive to light.  Cardiovascular:     Rate and Rhythm: Normal rate and regular rhythm.     Heart sounds: Normal heart sounds. No murmur heard.    No gallop.  Pulmonary:     Effort: Pulmonary effort is normal. No respiratory distress.     Breath sounds: Normal breath sounds. No wheezing or rales.  Skin:    General: Skin is warm and dry.  Neurological:     Mental Status: He is alert and oriented to person, place, and time.  Psychiatric:        Judgment: Judgment normal.    Results for orders placed or performed in visit on 12/21/21  POC Influenza A&B(BINAX/QUICKVUE)  Result Value Ref Range   Influenza A, POC Negative Negative   Influenza B, POC Negative Negative  POC COVID-19  Result Value Ref Range   SARS Coronavirus 2 Ag Negative Negative  POCT rapid strep A  Result Value Ref Range   Rapid Strep A Screen  Positive (A) Negative     Assessment and Plan:   Sore throat No red flags on exam COVID and flu test negative Strep test positive and concern for possible early L AOM and sinusitis Start oral augmentin x 10 days NSAIDs for pain/inflammation Rest and hydrate Work note declined  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  I, Jarold Motto, [place], have reviewed all documentation for this visit. The documentation on 12/21/21 for the exam, diagnosis, procedures, and orders are all accurate and complete.  Jarold Motto, PA-C

## 2021-12-23 ENCOUNTER — Encounter: Payer: BC Managed Care – PPO | Admitting: Adult Health

## 2022-03-11 ENCOUNTER — Other Ambulatory Visit: Payer: Self-pay | Admitting: Physician Assistant

## 2022-03-15 ENCOUNTER — Telehealth: Payer: Self-pay | Admitting: Adult Health

## 2022-03-15 NOTE — Telephone Encounter (Signed)
Pt needs a f/u visit.

## 2022-03-15 NOTE — Telephone Encounter (Signed)
Pt would like a refill on amphetamine-dextroamphetamine (ADDERALL) 20 MG tablet  [id number] - [place], [place] - [address] AT [address] [place] [address] Phone: [phone]  Fax: [phone]

## 2022-03-16 ENCOUNTER — Telehealth (INDEPENDENT_AMBULATORY_CARE_PROVIDER_SITE_OTHER): Payer: No Typology Code available for payment source | Admitting: Adult Health

## 2022-03-16 ENCOUNTER — Encounter: Payer: Self-pay | Admitting: Adult Health

## 2022-03-16 VITALS — Wt 255.0 lb

## 2022-03-16 DIAGNOSIS — J0140 Acute pansinusitis, unspecified: Secondary | ICD-10-CM | POA: Diagnosis not present

## 2022-03-16 DIAGNOSIS — F900 Attention-deficit hyperactivity disorder, predominantly inattentive type: Secondary | ICD-10-CM

## 2022-03-16 MED ORDER — AMPHETAMINE-DEXTROAMPHETAMINE 20 MG PO TABS: 20.0000 mg | ORAL_TABLET | Freq: Two times a day (BID) | ORAL | 0 refills | Status: DC

## 2022-03-16 MED ORDER — DOXYCYCLINE HYCLATE 100 MG PO CAPS: 100.0000 mg | ORAL_CAPSULE | Freq: Two times a day (BID) | ORAL | 0 refills | Status: DC

## 2022-03-16 NOTE — Telephone Encounter (Signed)
Please advise if appt. Is appropiate

## 2022-03-16 NOTE — Telephone Encounter (Signed)
Pt has been scheduled.  °

## 2022-03-16 NOTE — Progress Notes (Signed)
Virtual Visit via Video Note  I connected with Walter Haney on 03/16/22 at  2:45 PM EST by a video enabled telemedicine application and verified that I am speaking with the correct person using two identifiers.  Location patient: home Location provider:work or home office Persons participating in the virtual visit: patient, provider  I discussed the limitations of evaluation and management by telemedicine and the availability of in person appointments. The patient expressed understanding and agreed to proceed.   HPI:  He is being evaluated today for 59-month follow-up regarding ADHD management.  He is currently maintained on Adderall 20 mg twice daily.  He does feel well-controlled on this medication.  Denies side effects of insomnia or anorexia.  Additionally, he reports that over the last 7-10 days he had been battling with sinus pain/pressure, drainage, feeling of ear fullness. He has been using OTC cold and sinus medication which helps until it wears off. Denies fevers or chills.   ROS: See pertinent positives and negatives per HPI.  Past Medical History:  Diagnosis Date   ADHD    Seasonal allergies     No past surgical history on file.  Family History  Problem Relation Age of Onset   High blood pressure Father    High Cholesterol Father    Benign prostatic hyperplasia Father    Glaucoma Father        Current Outpatient Medications:    amoxicillin-clavulanate (AUGMENTIN) 875-125 MG tablet, Take 1 tablet by mouth 2 (two) times daily., Disp: 20 tablet, Rfl: 0   amphetamine-dextroamphetamine (ADDERALL) 20 MG tablet, Take 1 tablet (20 mg total) by mouth 2 (two) times daily., Disp: 60 tablet, Rfl: 0   amphetamine-dextroamphetamine (ADDERALL) 20 MG tablet, Take 1 tablet (20 mg total) by mouth 2 (two) times daily., Disp: 60 tablet, Rfl: 0   amphetamine-dextroamphetamine (ADDERALL) 20 MG tablet, Take 1 tablet (20 mg total) by mouth 2 (two) times daily., Disp: 60 tablet, Rfl: 0    cetirizine (ZYRTEC) 10 MG tablet, Take 10 mg by mouth daily., Disp: , Rfl:    fluticasone (FLONASE) 50 MCG/ACT nasal spray, SPRAY 2 SPRAYS INTO EACH NOSTRIL EVERY DAY, Disp: 48 mL, Rfl: 3  EXAM:  VITALS per patient if applicable:  GENERAL: alert, oriented, appears well and in no acute distress  HEENT: atraumatic, conjunttiva clear, no obvious abnormalities on inspection of external nose and ears  NECK: normal movements of the head and neck  LUNGS: on inspection no signs of respiratory distress, breathing rate appears normal, no obvious gross SOB, gasping or wheezing  CV: no obvious cyanosis  MS: moves all visible extremities without noticeable abnormality  PSYCH/NEURO: pleasant and cooperative, no obvious depression or anxiety, speech and thought processing grossly intact  ASSESSMENT AND PLAN:  Discussed the following assessment and plan:  1. Attention deficit hyperactivity disorder (ADHD), predominantly inattentive type  - amphetamine-dextroamphetamine (ADDERALL) 20 MG tablet; Take 1 tablet (20 mg total) by mouth 2 (two) times daily.  Dispense: 60 tablet; Refill: 0 - amphetamine-dextroamphetamine (ADDERALL) 20 MG tablet; Take 1 tablet (20 mg total) by mouth 2 (two) times daily.  Dispense: 60 tablet; Refill: 0 - amphetamine-dextroamphetamine (ADDERALL) 20 MG tablet; Take 1 tablet (20 mg total) by mouth 2 (two) times daily.  Dispense: 60 tablet; Refill: 0  2. Acute non-recurrent pansinusitis  - doxycycline (VIBRAMYCIN) 100 MG capsule; Take 1 capsule (100 mg total) by mouth 2 (two) times daily.  Dispense: 14 capsule; Refill: 0   I discussed the assessment and treatment plan  with the patient. The patient was provided an opportunity to ask questions and all were answered. The patient agreed with the plan and demonstrated an understanding of the instructions.   The patient was advised to call back or seek an in-person evaluation if the symptoms worsen or if the condition fails to  improve as anticipated.   Dorothyann Peng, NP

## 2022-05-05 ENCOUNTER — Ambulatory Visit (INDEPENDENT_AMBULATORY_CARE_PROVIDER_SITE_OTHER): Payer: No Typology Code available for payment source | Admitting: Adult Health

## 2022-05-05 ENCOUNTER — Encounter: Payer: Self-pay | Admitting: Adult Health

## 2022-05-05 VITALS — BP 110/80 | HR 65 | Temp 97.8°F | Ht 72.75 in | Wt 260.0 lb

## 2022-05-05 DIAGNOSIS — F900 Attention-deficit hyperactivity disorder, predominantly inattentive type: Secondary | ICD-10-CM

## 2022-05-05 DIAGNOSIS — Z0000 Encounter for general adult medical examination without abnormal findings: Secondary | ICD-10-CM | POA: Diagnosis not present

## 2022-05-05 DIAGNOSIS — E669 Obesity, unspecified: Secondary | ICD-10-CM | POA: Diagnosis not present

## 2022-05-05 DIAGNOSIS — E782 Mixed hyperlipidemia: Secondary | ICD-10-CM

## 2022-05-05 MED ORDER — SEMAGLUTIDE-WEIGHT MANAGEMENT 0.25 MG/0.5ML ~~LOC~~ SOAJ: 0.2500 mg | SUBCUTANEOUS | 0 refills | Status: DC

## 2022-05-05 NOTE — Progress Notes (Signed)
Subjective:    Patient ID: Walter Haney, male    DOB: 1985/05/09, 37 y.o.   MRN: [id number]  HPI Patient presents for yearly preventative medicine examination. He is a pleasant 37 year old male who  has a past medical history of ADHD and Seasonal allergies.  ADHD -prescribed Adderall 20 mg twice daily.  He feels as this dose controls his symptoms well.  Denies side effects such as insomnia or anorexia  Hyperlipidemia - mildly elevated LDL cholesterol in the past.   Obesity -  he reports that he has had a hard time losing weight, he eats healthy and exercises throughout the week but he is not seeing any weight loss. Wt Readings from Last 3 Encounters:  05/05/22 260 lb (117.9 kg)  03/16/22 255 lb (115.7 kg)  12/21/21 255 lb 4 oz (115.8 kg)   All immunizations and health maintenance protocols were reviewed with the patient and needed orders were placed.  Appropriate screening laboratory values were ordered for the patient including screening of hyperlipidemia, renal function and hepatic function.  Medication reconciliation,  past medical history, social history, problem list and allergies were reviewed in detail with the patient  Goals were established with regard to weight loss, exercise, and  diet in compliance with medications Wt Readings from Last 3 Encounters:  05/05/22 260 lb (117.9 kg)  03/16/22 255 lb (115.7 kg)  12/21/21 255 lb 4 oz (115.8 kg)   Review of Systems  Constitutional: Negative.   HENT: Negative.    Eyes: Negative.   Respiratory: Negative.    Cardiovascular: Negative.   Gastrointestinal: Negative.   Endocrine: Negative.   Genitourinary: Negative.   Musculoskeletal:  Positive for arthralgias and back pain.  Skin: Negative.   Allergic/Immunologic: Negative.   Neurological: Negative.   Hematological: Negative.   Psychiatric/Behavioral: Negative.    All other systems reviewed and are negative.  Past Medical History:  Diagnosis Date   ADHD     Seasonal allergies     Social History   Socioeconomic History   Marital status: Single    Spouse name: Not on file   Number of children: Not on file   Years of education: Not on file   Highest education level: Not on file  Occupational History   Not on file  Tobacco Use   Smoking status: Never   Smokeless tobacco: Current    Types: Chew  Vaping Use   Vaping Use: Never used  Substance and Sexual Activity   Alcohol use: Yes    Alcohol/week: 3.0 standard drinks of alcohol    Types: 3 Cans of beer per week   Drug use: Not on file   Sexual activity: Yes    Partners: Female  Other Topics Concern   Not on file  Social History Narrative   Not on file   Social Determinants of Health   Financial Resource Strain: Not on file  Food Insecurity: Not on file  Transportation Needs: Not on file  Physical Activity: Not on file  Stress: Not on file  Social Connections: Not on file  Intimate Partner Violence: Not on file    History reviewed. No pertinent surgical history.  Family History  Problem Relation Age of Onset   High blood pressure Father    High Cholesterol Father    Benign prostatic hyperplasia Father    Glaucoma Father     No Known Allergies  Current Outpatient Medications on File Prior to Visit  Medication Sig Dispense Refill  amphetamine-dextroamphetamine (ADDERALL) 20 MG tablet Take 1 tablet (20 mg total) by mouth 2 (two) times daily. 60 tablet 0   amphetamine-dextroamphetamine (ADDERALL) 20 MG tablet Take 1 tablet (20 mg total) by mouth 2 (two) times daily. 60 tablet 0   amphetamine-dextroamphetamine (ADDERALL) 20 MG tablet Take 1 tablet (20 mg total) by mouth 2 (two) times daily. 60 tablet 0   cetirizine (ZYRTEC) 10 MG tablet Take 10 mg by mouth daily.     fluticasone (FLONASE) 50 MCG/ACT nasal spray SPRAY 2 SPRAYS INTO EACH NOSTRIL EVERY DAY 48 mL 3   No current facility-administered medications on file prior to visit.    BP 110/80   Pulse 65   Temp  97.8 F (36.6 C) (Oral)   Ht 6' 0.75" (1.848 m)   Wt 260 lb (117.9 kg)   SpO2 98%   BMI 34.54 kg/m       Objective:   Physical Exam Vitals and nursing note reviewed.  Constitutional:      General: He is not in acute distress.    Appearance: Normal appearance. He is obese. He is not ill-appearing.  HENT:     Head: Normocephalic and atraumatic.     Right Ear: Tympanic membrane, ear canal and external ear normal. There is no impacted cerumen.     Left Ear: Tympanic membrane, ear canal and external ear normal. There is no impacted cerumen.     Nose: Nose normal. No congestion or rhinorrhea.     Mouth/Throat:     Mouth: Mucous membranes are moist.     Pharynx: Oropharynx is clear.  Eyes:     Extraocular Movements: Extraocular movements intact.     Conjunctiva/sclera: Conjunctivae normal.     Pupils: Pupils are equal, round, and reactive to light.  Neck:     Vascular: No carotid bruit.  Cardiovascular:     Rate and Rhythm: Normal rate and regular rhythm.     Pulses: Normal pulses.     Heart sounds: No murmur heard.    No friction rub. No gallop.  Pulmonary:     Effort: Pulmonary effort is normal.     Breath sounds: Normal breath sounds.  Abdominal:     General: Abdomen is flat. Bowel sounds are normal. There is no distension.     Palpations: Abdomen is soft. There is no mass.     Tenderness: There is no abdominal tenderness. There is no guarding or rebound.     Hernia: No hernia is present.  Musculoskeletal:        General: Normal range of motion.     Cervical back: Normal range of motion and neck supple.  Lymphadenopathy:     Cervical: No cervical adenopathy.  Skin:    General: Skin is warm and dry.     Capillary Refill: Capillary refill takes less than 2 seconds.  Neurological:     General: No focal deficit present.     Mental Status: He is alert and oriented to person, place, and time.  Psychiatric:        Mood and Affect: Mood normal.        Behavior: Behavior  normal.        Thought Content: Thought content normal.        Judgment: Judgment normal.       Assessment & Plan:  1. Routine general medical examination at a health care facility - Continue lifestyle modifications  - Follow up in one year or sooner if needed - Lipid  panel; Future - TSH; Future - CBC; Future - Comprehensive metabolic panel; Future  2. Attention deficit hyperactivity disorder (ADHD), predominantly inattentive type - Continue with Adderall  - Lipid panel; Future - TSH; Future - CBC; Future - Comprehensive metabolic panel; Future  3. Mixed hyperlipidemia - Consider statin  - Lipid panel; Future - TSH; Future - CBC; Future - Comprehensive metabolic panel; Future  4. Class 1 obesity - will trial him on Wegovy. Follow up in one month after starting  - Side effects reviewed  - Continue with lifestyle modifications  - Semaglutide-Weight Management 0.25 MG/0.5ML SOAJ; Inject 0.25 mg into the skin once a week.  Dispense: 2 mL; Refill: 0   Dorothyann Peng, NP

## 2022-05-13 ENCOUNTER — Other Ambulatory Visit: Payer: No Typology Code available for payment source

## 2022-05-13 DIAGNOSIS — F900 Attention-deficit hyperactivity disorder, predominantly inattentive type: Secondary | ICD-10-CM | POA: Diagnosis not present

## 2022-05-13 DIAGNOSIS — Z0000 Encounter for general adult medical examination without abnormal findings: Secondary | ICD-10-CM

## 2022-05-13 DIAGNOSIS — E782 Mixed hyperlipidemia: Secondary | ICD-10-CM | POA: Diagnosis not present

## 2022-05-13 LAB — LIPID PANEL
Cholesterol: 164 mg/dL (ref 0–200)
HDL: 46.7 mg/dL (ref >39.00)
LDL Cholesterol: 98 mg/dL (ref 0–99)
NonHDL: 117.21
Total CHOL/HDL Ratio: 4
Triglycerides: 98 mg/dL (ref 0.0–149.0)
VLDL: 19.6 mg/dL (ref 0.0–40.0)

## 2022-05-13 LAB — CBC
HCT: 44.7 % (ref 39.0–52.0)
Hemoglobin: 15.4 g/dL (ref 13.0–17.0)
MCHC: 34.5 g/dL (ref 30.0–36.0)
MCV: 94.5 fl (ref 78.0–100.0)
Platelets: 261 10*3/uL (ref 150.0–400.0)
RBC: 4.73 Mil/uL (ref 4.22–5.81)
RDW: 12.9 % (ref 11.5–15.5)
WBC: 4.1 10*3/uL (ref 4.0–10.5)

## 2022-05-13 LAB — COMPREHENSIVE METABOLIC PANEL
ALT: 36 U/L (ref 0–53)
AST: 23 U/L (ref 0–37)
Albumin: 4.1 g/dL (ref 3.5–5.2)
Alkaline Phosphatase: 41 U/L (ref 39–117)
BUN: 18 mg/dL (ref 6–23)
CO2: 26 mEq/L (ref 19–32)
Calcium: 9.3 mg/dL (ref 8.4–10.5)
Chloride: 106 mEq/L (ref 96–112)
Creatinine, Ser: 1 mg/dL (ref 0.40–1.50)
GFR: 96.83 mL/min (ref >60.00)
Glucose, Bld: 91 mg/dL (ref 70–99)
Potassium: 4.2 mEq/L (ref 3.5–5.1)
Sodium: 140 mEq/L (ref 135–145)
Total Bilirubin: 0.4 mg/dL (ref 0.2–1.2)
Total Protein: 6.8 g/dL (ref 6.0–8.3)

## 2022-05-13 LAB — TSH: TSH: 2.4 u[IU]/mL (ref 0.35–5.50)

## 2022-05-24 ENCOUNTER — Telehealth: Payer: Self-pay | Admitting: Adult Health

## 2022-05-24 NOTE — Telephone Encounter (Signed)
Pt is still waiting on PA for wegovy and in the meantime pt would like an steroid  to be sent to  [place], [address] Phone: [phone]  Fax: [phone]    For his sciatica pain

## 2022-05-25 ENCOUNTER — Other Ambulatory Visit: Payer: Self-pay | Admitting: Adult Health

## 2022-05-25 MED ORDER — METHYLPREDNISOLONE 4 MG PO TBPK: ORAL_TABLET | ORAL | 0 refills | Status: DC

## 2022-05-25 NOTE — Telephone Encounter (Signed)
Patient notified of update  and verbalized understanding. 

## 2022-05-25 NOTE — Telephone Encounter (Signed)
Izyk Kratz (Key: [facility]) Rx #: [id number] 0.25MG /0.5ML auto-injectors Form [employer] Electronic PA Form [phone] NCPDP) Created 20 days ago Sent to Plan 7 hours ago Plan Response 7 hours ago Submit Clinical Questions 6 hours ago Determination Favorable 5 hours ago Your prior authorization for Mancel Parsons has been approved! MORE INFO Personalized support and financial assistance may be available through the [employer] program. For more information, and to see program requirements, click on the More Info button to the right.  Message from plan: Your PA request has been approved. Additional information will be provided in the approval communication. (Message 1145). Authorization Expiration Date: December 25, 2022.

## 2022-05-25 NOTE — Telephone Encounter (Signed)
Walter Haney (Key: [facility]) Rx #: [id number] 0.25MG /0.5ML auto-injectors  PA started

## 2022-06-28 ENCOUNTER — Other Ambulatory Visit: Payer: Self-pay | Admitting: Adult Health

## 2022-06-28 DIAGNOSIS — E669 Obesity, unspecified: Secondary | ICD-10-CM

## 2022-06-30 NOTE — Telephone Encounter (Signed)
Patient need to schedule an ov for more refills. Lm for pt to call and schedule.

## 2022-07-01 ENCOUNTER — Encounter: Payer: Self-pay | Admitting: Adult Health

## 2022-07-01 ENCOUNTER — Telehealth (INDEPENDENT_AMBULATORY_CARE_PROVIDER_SITE_OTHER): Payer: No Typology Code available for payment source | Admitting: Adult Health

## 2022-07-01 VITALS — Ht 72.9 in | Wt 252.0 lb

## 2022-07-01 DIAGNOSIS — E669 Obesity, unspecified: Secondary | ICD-10-CM

## 2022-07-01 MED ORDER — WEGOVY 0.5 MG/0.5ML ~~LOC~~ SOAJ
0.5000 mg | SUBCUTANEOUS | 0 refills | Status: DC
Start: 2022-07-01 — End: 2022-07-30

## 2022-07-01 NOTE — Progress Notes (Signed)
Virtual Visit via Video Note  I connected withNAME@ on 07/01/22 at  3:30 PM EDT by a video enabled telemedicine application and verified that I am speaking with the correct person using two identifiers.  Location patient: home Location provider:work or home office Persons participating in the virtual visit: patient, provider  I discussed the limitations of evaluation and management by telemedicine and the availability of in person appointments. The patient expressed understanding and agreed to proceed.   HPI:  37 year old male who  has a past medical history of ADHD and Seasonal allergies.  He is being evaluated today for one month follow up regarding weight loss management. He was started on Wegovy 0.25 mg about a month ago. He has tolerated this medication well with no GI side effects. He has been watching what he is eating. He is walking a lot at work.   Wt Readings from Last 10 Encounters:  07/01/22 252 lb (114.3 kg)  05/05/22 260 lb (117.9 kg)  03/16/22 255 lb (115.7 kg)  12/21/21 255 lb 4 oz (115.8 kg)  06/25/21 258 lb (117 kg)  06/19/21 259 lb (117.5 kg)  01/07/21 259 lb (117.5 kg)  01/04/20 246 lb 6.4 oz (111.8 kg)  11/15/18 252 lb (114.3 kg)  05/18/18 247 lb (112 kg)  ]  ROS: See pertinent positives and negatives per HPI.  Past Medical History:  Diagnosis Date   ADHD    Seasonal allergies     History reviewed. No pertinent surgical history.  Family History  Problem Relation Age of Onset   High blood pressure Father    High Cholesterol Father    Benign prostatic hyperplasia Father    Glaucoma Father        Current Outpatient Medications:    amphetamine-dextroamphetamine (ADDERALL) 20 MG tablet, Take 1 tablet (20 mg total) by mouth 2 (two) times daily., Disp: 60 tablet, Rfl: 0   amphetamine-dextroamphetamine (ADDERALL) 20 MG tablet, Take 1 tablet (20 mg total) by mouth 2 (two) times daily., Disp: 60 tablet, Rfl: 0   amphetamine-dextroamphetamine (ADDERALL)  20 MG tablet, Take 1 tablet (20 mg total) by mouth 2 (two) times daily., Disp: 60 tablet, Rfl: 0   cetirizine (ZYRTEC) 10 MG tablet, Take 10 mg by mouth daily., Disp: , Rfl:    fluticasone (FLONASE) 50 MCG/ACT nasal spray, SPRAY 2 SPRAYS INTO EACH NOSTRIL EVERY DAY, Disp: 48 mL, Rfl: 3   methylPREDNISolone (MEDROL DOSEPAK) 4 MG TBPK tablet, Take as directed, Disp: 21 tablet, Rfl: 0   Semaglutide-Weight Management 0.25 MG/0.5ML SOAJ, Inject 0.25 mg into the skin once a week., Disp: 2 mL, Rfl: 0  EXAM:  VITALS per patient if applicable:  GENERAL: alert, oriented, appears well and in no acute distress  HEENT: atraumatic, conjunttiva clear, no obvious abnormalities on inspection of external nose and ears  NECK: normal movements of the head and neck  LUNGS: on inspection no signs of respiratory distress, breathing rate appears normal, no obvious gross SOB, gasping or wheezing  CV: no obvious cyanosis  MS: moves all visible extremities without noticeable abnormality  PSYCH/NEURO: pleasant and cooperative, no obvious depression or anxiety, speech and thought processing grossly intact  ASSESSMENT AND PLAN:  Discussed the following assessment and plan:  1. Class 1 obesity - Will increase his Wegovy to 0.5 mg weekly.  - Follow up in one month  - Continue to exercise and eat healthy  - Semaglutide-Weight Management (WEGOVY) 0.5 MG/0.5ML SOAJ; Inject 0.5 mg into the skin once a  week.  Dispense: 2 mL; Refill: 0      I discussed the assessment and treatment plan with the patient. The patient was provided an opportunity to ask questions and all were answered. The patient agreed with the plan and demonstrated an understanding of the instructions.   The patient was advised to call back or seek an in-person evaluation if the symptoms worsen or if the condition fails to improve as anticipated.   Shirline Frees, NP

## 2022-07-28 ENCOUNTER — Telehealth: Payer: Self-pay | Admitting: Adult Health

## 2022-07-28 NOTE — Telephone Encounter (Signed)
Prescription Request  07/28/2022  LOV: 05/05/2022  What is the name of the medication or equipment?   amphetamine-dextroamphetamine amphetamine-dextroamphetamine (ADDERALL) 20 MG tablet (Completely out since last week)  Wegovy Semaglutide-Weight Management (WEGOVY) 0.5 MG/0.5ML SOAJ (Completely out since Monday)  Have you contacted your pharmacy to request a refill? Yes   Which pharmacy would you like this sent to?    [id number] - [place], [place] - [address] [place]  [id number] Phone: [phone] Fax: [phone]  Patient notified that their request is being sent to the clinical staff for review and that they should receive a response within 2 business days.   Please advise at Mobile [phone] (mobile)

## 2022-07-29 ENCOUNTER — Other Ambulatory Visit: Payer: Self-pay | Admitting: Adult Health

## 2022-07-29 DIAGNOSIS — F900 Attention-deficit hyperactivity disorder, predominantly inattentive type: Secondary | ICD-10-CM

## 2022-07-29 MED ORDER — AMPHETAMINE-DEXTROAMPHETAMINE 20 MG PO TABS
20.0000 mg | ORAL_TABLET | Freq: Two times a day (BID) | ORAL | 0 refills | Status: DC
Start: 2022-07-29 — End: 2022-12-13

## 2022-07-29 MED ORDER — AMPHETAMINE-DEXTROAMPHETAMINE 20 MG PO TABS
20.0000 mg | ORAL_TABLET | Freq: Two times a day (BID) | ORAL | 0 refills | Status: DC
Start: 2022-07-29 — End: 2023-02-22

## 2022-07-29 NOTE — Telephone Encounter (Signed)
Left message to return phone call.

## 2022-07-29 NOTE — Telephone Encounter (Signed)
Noted  

## 2022-07-29 NOTE — Telephone Encounter (Signed)
Look like pt is due for follow up for both Rx. Please confirm

## 2022-07-29 NOTE — Telephone Encounter (Signed)
Patient notified of update  and verbalized understanding. Pt has been scheduled  °

## 2022-07-30 ENCOUNTER — Telehealth (INDEPENDENT_AMBULATORY_CARE_PROVIDER_SITE_OTHER): Payer: No Typology Code available for payment source | Admitting: Adult Health

## 2022-07-30 ENCOUNTER — Encounter: Payer: Self-pay | Admitting: Adult Health

## 2022-07-30 VITALS — Wt 247.0 lb

## 2022-07-30 DIAGNOSIS — E669 Obesity, unspecified: Secondary | ICD-10-CM | POA: Diagnosis not present

## 2022-07-30 MED ORDER — WEGOVY 1 MG/0.5ML ~~LOC~~ SOAJ
1.0000 mg | SUBCUTANEOUS | 0 refills | Status: DC
Start: 2022-07-30 — End: 2023-02-22

## 2022-07-30 NOTE — Progress Notes (Signed)
Virtual Visit via Video Note  I connected with Walter Haney on 07/30/22 at  4:15 PM EDT by a video enabled telemedicine application and verified that I am speaking with the correct person using two identifiers.  Location patient: home Location provider:work or home office Persons participating in the virtual visit: patient, provider  I discussed the limitations of evaluation and management by telemedicine and the availability of in person appointments. The patient expressed understanding and agreed to proceed.   HPI: He presents to the office today follow up regarding weight loss management  Obesity - currently managed with Wegovy 0.5 mg weekly. He has been tolerating this medication well.  He has also been working on watching what he is eating, trying to eat healthier and is walking multiple miles a day while at work.  Wt Readings from Last 3 Encounters:  07/30/22 247 lb (112 kg)  07/01/22 252 lb (114.3 kg)  05/05/22 260 lb (117.9 kg)      ROS: See pertinent positives and negatives per HPI.  Past Medical History:  Diagnosis Date   ADHD    Seasonal allergies     No past surgical history on file.  Family History  Problem Relation Age of Onset   High blood pressure Father    High Cholesterol Father    Benign prostatic hyperplasia Father    Glaucoma Father        Current Outpatient Medications:    amphetamine-dextroamphetamine (ADDERALL) 20 MG tablet, Take 1 tablet (20 mg total) by mouth 2 (two) times daily., Disp: 60 tablet, Rfl: 0   amphetamine-dextroamphetamine (ADDERALL) 20 MG tablet, Take 1 tablet (20 mg total) by mouth 2 (two) times daily., Disp: 60 tablet, Rfl: 0   amphetamine-dextroamphetamine (ADDERALL) 20 MG tablet, Take 1 tablet (20 mg total) by mouth 2 (two) times daily., Disp: 60 tablet, Rfl: 0   cetirizine (ZYRTEC) 10 MG tablet, Take 10 mg by mouth daily., Disp: , Rfl:    fluticasone (FLONASE) 50 MCG/ACT nasal spray, SPRAY 2 SPRAYS INTO EACH NOSTRIL EVERY  DAY, Disp: 48 mL, Rfl: 3  EXAM:  VITALS per patient if applicable:  GENERAL: alert, oriented, appears well and in no acute distress  HEENT: atraumatic, conjunttiva clear, no obvious abnormalities on inspection of external nose and ears  NECK: normal movements of the head and neck  LUNGS: on inspection no signs of respiratory distress, breathing rate appears normal, no obvious gross SOB, gasping or wheezing  CV: no obvious cyanosis  MS: moves all visible extremities without noticeable abnormality  PSYCH/NEURO: pleasant and cooperative, no obvious depression or anxiety, speech and thought processing grossly intact  ASSESSMENT AND PLAN:  Discussed the following assessment and plan:  1. Class 1 obesity - Will increase Wegovy to 1 mg weekly.  - Follow up in one month  - Semaglutide-Weight Management (WEGOVY) 1 MG/0.5ML SOAJ; Inject 1 mg into the skin once a week.  Dispense: 2 mL; Refill: 0     I discussed the assessment and treatment plan with the patient. The patient was provided an opportunity to ask questions and all were answered. The patient agreed with the plan and demonstrated an understanding of the instructions.   The patient was advised to call back or seek an in-person evaluation if the symptoms worsen or if the condition fails to improve as anticipated.   Shirline Frees, NP

## 2022-12-13 ENCOUNTER — Other Ambulatory Visit: Payer: Self-pay | Admitting: Adult Health

## 2022-12-13 DIAGNOSIS — F900 Attention-deficit hyperactivity disorder, predominantly inattentive type: Secondary | ICD-10-CM

## 2022-12-13 NOTE — Telephone Encounter (Signed)
Okay for refill?  

## 2022-12-14 MED ORDER — AMPHETAMINE-DEXTROAMPHETAMINE 20 MG PO TABS
20.0000 mg | ORAL_TABLET | Freq: Two times a day (BID) | ORAL | 0 refills | Status: DC
Start: 2022-12-14 — End: 2023-02-17

## 2023-02-17 ENCOUNTER — Other Ambulatory Visit: Payer: Self-pay | Admitting: Adult Health

## 2023-02-17 DIAGNOSIS — F900 Attention-deficit hyperactivity disorder, predominantly inattentive type: Secondary | ICD-10-CM

## 2023-02-17 NOTE — Telephone Encounter (Signed)
Okay for refill?  

## 2023-02-18 MED ORDER — AMPHETAMINE-DEXTROAMPHETAMINE 20 MG PO TABS
20.0000 mg | ORAL_TABLET | Freq: Two times a day (BID) | ORAL | 0 refills | Status: DC
Start: 2023-02-18 — End: 2023-08-12

## 2023-02-18 NOTE — Telephone Encounter (Signed)
Pt notified of update and has been scheduled for a virtual. No further action needed.

## 2023-02-22 ENCOUNTER — Telehealth (INDEPENDENT_AMBULATORY_CARE_PROVIDER_SITE_OTHER): Payer: No Typology Code available for payment source | Admitting: Adult Health

## 2023-02-22 ENCOUNTER — Encounter: Payer: Self-pay | Admitting: Adult Health

## 2023-02-22 VITALS — HR 77 | Ht 72.09 in | Wt 245.0 lb

## 2023-02-22 DIAGNOSIS — F900 Attention-deficit hyperactivity disorder, predominantly inattentive type: Secondary | ICD-10-CM

## 2023-02-22 DIAGNOSIS — E66811 Obesity, class 1: Secondary | ICD-10-CM | POA: Diagnosis not present

## 2023-02-22 MED ORDER — AMPHETAMINE-DEXTROAMPHETAMINE 20 MG PO TABS: 20.0000 mg | ORAL_TABLET | Freq: Two times a day (BID) | ORAL | 0 refills | Status: DC

## 2023-02-22 MED ORDER — WEGOVY 0.5 MG/0.5ML ~~LOC~~ SOAJ: 0.5000 mg | SUBCUTANEOUS | 0 refills | Status: DC

## 2023-02-22 NOTE — Progress Notes (Signed)
Virtual Visit via Video Note  I connected with Walter Haney on 02/22/23 at  4:15 PM EST by a video enabled telemedicine application and verified that I am speaking with the correct person using two identifiers.  Location patient: home Location provider:work or home office Persons participating in the virtual visit: patient, provider  I discussed the limitations of evaluation and management by telemedicine and the availability of in person appointments. The patient expressed understanding and agreed to proceed.   HPI: 37 year old male who  has a past medical history of ADHD and Seasonal allergies.  ADHD -prescribed Adderall 20 mg twice daily.  He feels as this dose controls his symptoms well.  Denies side effects such as insomnia or anorexia  Obesity - in the past he was on Wegovy and was doing well with the medication weight loss wise. He had to stop taking it as " I never knew when I would have to use the restroom". He is working as an Art gallery manager at the [employer] and the closest bathroom was about a half mile away. As construction has continued he has a bathroom much closer and would like to go back on the medication.  Wt Readings from Last 3 Encounters:  02/22/23 245 lb (111.1 kg)  07/30/22 247 lb (112 kg)  07/01/22 252 lb (114.3 kg)      ROS: See pertinent positives and negatives per HPI.  Past Medical History:  Diagnosis Date   ADHD    Seasonal allergies     No past surgical history on file.  Family History  Problem Relation Age of Onset   High blood pressure Father    High Cholesterol Father    Benign prostatic hyperplasia Father    Glaucoma Father        Current Outpatient Medications:    amphetamine-dextroamphetamine (ADDERALL) 20 MG tablet, Take 1 tablet (20 mg total) by mouth 2 (two) times daily., Disp: 60 tablet, Rfl: 0   amphetamine-dextroamphetamine (ADDERALL) 20 MG tablet, Take 1 tablet (20 mg total) by mouth 2 (two) times daily., Disp: 60 tablet,  Rfl: 0   amphetamine-dextroamphetamine (ADDERALL) 20 MG tablet, Take 1 tablet (20 mg total) by mouth 2 (two) times daily., Disp: 60 tablet, Rfl: 0   cetirizine (ZYRTEC) 10 MG tablet, Take 10 mg by mouth daily., Disp: , Rfl:    fluticasone (FLONASE) 50 MCG/ACT nasal spray, SPRAY 2 SPRAYS INTO EACH NOSTRIL EVERY DAY, Disp: 48 mL, Rfl: 3   Semaglutide-Weight Management (WEGOVY) 1 MG/0.5ML SOAJ, Inject 1 mg into the skin once a week., Disp: 2 mL, Rfl: 0  EXAM:  VITALS per patient if applicable:  GENERAL: alert, oriented, appears well and in no acute distress  HEENT: atraumatic, conjunttiva clear, no obvious abnormalities on inspection of external nose and ears  NECK: normal movements of the head and neck  LUNGS: on inspection no signs of respiratory distress, breathing rate appears normal, no obvious gross SOB, gasping or wheezing  CV: no obvious cyanosis  MS: moves all visible extremities without noticeable abnormality  PSYCH/NEURO: pleasant and cooperative, no obvious depression or anxiety, speech and thought processing grossly intact  ASSESSMENT AND PLAN:  Discussed the following assessment and plan:  1. Attention deficit hyperactivity disorder (ADHD), predominantly inattentive type (Primary)  - amphetamine-dextroamphetamine (ADDERALL) 20 MG tablet; Take 1 tablet (20 mg total) by mouth 2 (two) times daily.  Dispense: 60 tablet; Refill: 0 - amphetamine-dextroamphetamine (ADDERALL) 20 MG tablet; Take 1 tablet (20 mg total) by mouth 2 (  two) times daily.  Dispense: 60 tablet; Refill: 0  2. Class 1 obesity - Will place back on Wegovy 0.5 mg weekly.  - Follow up in one month to titrate up - Semaglutide-Weight Management (WEGOVY) 0.5 MG/0.5ML SOAJ; Inject 0.5 mg into the skin once a week.  Dispense: 2 mL; Refill: 0     I discussed the assessment and treatment plan with the patient. The patient was provided an opportunity to ask questions and all were answered. The patient agreed with  the plan and demonstrated an understanding of the instructions.   The patient was advised to call back or seek an in-person evaluation if the symptoms worsen or if the condition fails to improve as anticipated.   Shirline Frees, NP

## 2023-03-01 ENCOUNTER — Encounter: Payer: Self-pay | Admitting: Adult Health

## 2023-03-01 ENCOUNTER — Ambulatory Visit: Payer: No Typology Code available for payment source | Admitting: Adult Health

## 2023-03-01 VITALS — BP 110/80 | HR 56 | Temp 97.9°F | Ht 72.5 in | Wt 258.0 lb

## 2023-03-01 DIAGNOSIS — Z0000 Encounter for general adult medical examination without abnormal findings: Secondary | ICD-10-CM | POA: Diagnosis not present

## 2023-03-01 DIAGNOSIS — F900 Attention-deficit hyperactivity disorder, predominantly inattentive type: Secondary | ICD-10-CM | POA: Diagnosis not present

## 2023-03-01 DIAGNOSIS — E66811 Obesity, class 1: Secondary | ICD-10-CM | POA: Diagnosis not present

## 2023-03-01 LAB — CBC
HCT: 43.7 % (ref 39.0–52.0)
Hemoglobin: 15.2 g/dL (ref 13.0–17.0)
MCHC: 34.7 g/dL (ref 30.0–36.0)
MCV: 95.4 fL (ref 78.0–100.0)
Platelets: 247 10*3/uL (ref 150.0–400.0)
RBC: 4.58 Mil/uL (ref 4.22–5.81)
RDW: 12.9 % (ref 11.5–15.5)
WBC: 4.4 10*3/uL (ref 4.0–10.5)

## 2023-03-01 LAB — COMPREHENSIVE METABOLIC PANEL
ALT: 26 U/L (ref 0–53)
AST: 18 U/L (ref 0–37)
Albumin: 4.5 g/dL (ref 3.5–5.2)
Alkaline Phosphatase: 45 U/L (ref 39–117)
BUN: 13 mg/dL (ref 6–23)
CO2: 29 meq/L (ref 19–32)
Calcium: 8.9 mg/dL (ref 8.4–10.5)
Chloride: 105 meq/L (ref 96–112)
Creatinine, Ser: 0.89 mg/dL (ref 0.40–1.50)
GFR: 109.64 mL/min (ref >60.00)
Glucose, Bld: 92 mg/dL (ref 70–99)
Potassium: 3.9 meq/L (ref 3.5–5.1)
Sodium: 142 meq/L (ref 135–145)
Total Bilirubin: 0.6 mg/dL (ref 0.2–1.2)
Total Protein: 6.7 g/dL (ref 6.0–8.3)

## 2023-03-01 LAB — LIPID PANEL
Cholesterol: 156 mg/dL (ref 0–200)
HDL: 48.5 mg/dL (ref >39.00)
LDL Cholesterol: 83 mg/dL (ref 0–99)
NonHDL: 107.52
Total CHOL/HDL Ratio: 3
Triglycerides: 125 mg/dL (ref 0.0–149.0)
VLDL: 25 mg/dL (ref 0.0–40.0)

## 2023-03-01 LAB — TSH: TSH: 2.45 u[IU]/mL (ref 0.35–5.50)

## 2023-03-01 NOTE — Progress Notes (Signed)
Subjective:    Patient ID: Walter Haney, male    DOB: [birth year], 37 y.o.   MRN: [id number]  HPI Patient presents for yearly preventative medicine examination. He is a pleasant 37 year old male who  has a past medical history of ADHD and Seasonal allergies.  ADHD -prescribed Adderall 20 mg twice daily.  He feels as this dose controls his symptoms well.  Denies side effects such as insomnia or anorexia  Obesity - he was restarted on Wegovy 0.5 mg weekly last week. He has not filled this yet.  Wt Readings from Last 3 Encounters:  03/01/23 258 lb (117 kg)  02/22/23 245 lb (111.1 kg)  07/30/22 247 lb (112 kg)   All immunizations and health maintenance protocols were reviewed with the patient and needed orders were placed.  Appropriate screening laboratory values were ordered for the patient including screening of hyperlipidemia, renal function and hepatic function. If indicated by BPH, a PSA was ordered.  Medication reconciliation,  past medical history, social history, problem list and allergies were reviewed in detail with the patient  Goals were established with regard to weight loss, exercise, and  diet in compliance with medications   Review of Systems  Constitutional: Negative.   HENT: Negative.    Eyes: Negative.   Respiratory: Negative.    Cardiovascular: Negative.   Gastrointestinal: Negative.   Endocrine: Negative.   Genitourinary: Negative.   Musculoskeletal: Negative.   Skin: Negative.   Allergic/Immunologic: Negative.   Neurological: Negative.   Hematological: Negative.   Psychiatric/Behavioral: Negative.    All other systems reviewed and are negative.  Past Medical History:  Diagnosis Date   ADHD    Seasonal allergies     Social History   Socioeconomic History   Marital status: Single    Spouse name: Not on file   Number of children: Not on file   Years of education: Not on file   Highest education level: Not on file  Occupational History    Not on file  Tobacco Use   Smoking status: Never   Smokeless tobacco: Current    Types: Chew  Vaping Use   Vaping status: Never Used  Substance and Sexual Activity   Alcohol use: Yes    Alcohol/week: 3.0 standard drinks of alcohol    Types: 3 Cans of beer per week   Drug use: Not on file   Sexual activity: Yes    Partners: Female  Other Topics Concern   Not on file  Social History Narrative   Not on file   Social Drivers of Health   Financial Resource Strain: Not on file  Food Insecurity: Not on file  Transportation Needs: Not on file  Physical Activity: Not on file  Stress: Not on file  Social Connections: Unknown (07/10/2021)   Received from [facility]   Social Network    Social Network: Not on file  Intimate Partner Violence: Unknown (06/08/2021)   Received from [facility]   HITS    Physically Hurt: Not on file    Insult or Talk Down To: Not on file    Threaten Physical Harm: Not on file    Scream or Curse: Not on file    History reviewed. No pertinent surgical history.  Family History  Problem Relation Age of Onset   High blood pressure Father    High Cholesterol Father    Benign prostatic hyperplasia Father    Glaucoma Father  No Known Allergies  Current Outpatient Medications on File Prior to Visit  Medication Sig Dispense Refill   amphetamine-dextroamphetamine (ADDERALL) 20 MG tablet Take 1 tablet (20 mg total) by mouth 2 (two) times daily. 60 tablet 0   amphetamine-dextroamphetamine (ADDERALL) 20 MG tablet Take 1 tablet (20 mg total) by mouth 2 (two) times daily. 60 tablet 0   amphetamine-dextroamphetamine (ADDERALL) 20 MG tablet Take 1 tablet (20 mg total) by mouth 2 (two) times daily. 60 tablet 0   cetirizine (ZYRTEC) 10 MG tablet Take 10 mg by mouth daily.     fluticasone (FLONASE) 50 MCG/ACT nasal spray SPRAY 2 SPRAYS INTO EACH NOSTRIL EVERY DAY 48 mL 3   Semaglutide-Weight Management (WEGOVY) 0.5 MG/0.5ML SOAJ  Inject 0.5 mg into the skin once a week. (Patient not taking: Reported on 03/01/2023) 2 mL 0   No current facility-administered medications on file prior to visit.    BP 110/80   Pulse (!) 56   Temp 97.9 F (36.6 C) (Oral)   Ht 6' 0.5" (1.842 m)   Wt 258 lb (117 kg)   SpO2 99%   BMI 34.51 kg/m       Objective:   Physical Exam Vitals and nursing note reviewed.  Constitutional:      General: He is not in acute distress.    Appearance: Normal appearance. He is obese. He is not ill-appearing.  HENT:     Head: Normocephalic and atraumatic.     Right Ear: Tympanic membrane, ear canal and external ear normal. There is no impacted cerumen.     Left Ear: Tympanic membrane, ear canal and external ear normal. There is no impacted cerumen.     Nose: Nose normal. No congestion or rhinorrhea.     Mouth/Throat:     Mouth: Mucous membranes are moist.     Pharynx: Oropharynx is clear.  Eyes:     Extraocular Movements: Extraocular movements intact.     Conjunctiva/sclera: Conjunctivae normal.     Pupils: Pupils are equal, round, and reactive to light.  Neck:     Vascular: No carotid bruit.  Cardiovascular:     Rate and Rhythm: Normal rate and regular rhythm.     Pulses: Normal pulses.     Heart sounds: No murmur heard.    No friction rub. No gallop.  Pulmonary:     Effort: Pulmonary effort is normal.     Breath sounds: Normal breath sounds.  Abdominal:     General: Abdomen is flat. Bowel sounds are normal. There is no distension.     Palpations: Abdomen is soft. There is no mass.     Tenderness: There is no abdominal tenderness. There is no guarding or rebound.     Hernia: No hernia is present.  Musculoskeletal:        General: Normal range of motion.     Cervical back: Normal range of motion and neck supple.  Lymphadenopathy:     Cervical: No cervical adenopathy.  Skin:    General: Skin is warm and dry.     Capillary Refill: Capillary refill takes less than 2 seconds.   Neurological:     General: No focal deficit present.     Mental Status: He is alert and oriented to person, place, and time.     Motor: No weakness.  Psychiatric:        Mood and Affect: Mood normal.        Behavior: Behavior normal.  Thought Content: Thought content normal.        Judgment: Judgment normal.       Assessment & Plan:  1. Routine general medical examination at a health care facility (Primary) Today patient counseled on age appropriate routine health concerns for screening and prevention, each reviewed and up to date or declined. Immunizations reviewed and up to date or declined. Labs ordered and reviewed. Risk factors for depression reviewed and negative. Hearing function and visual acuity are intact. ADLs screened and addressed as needed. Functional ability and level of safety reviewed and appropriate. Education, counseling and referrals performed based on assessed risks today. Patient provided with a copy of personalized plan for preventive services. - Declined flu shot  2. Class 1 obesity - Encouraged lifestyle modifications  - Follow up in one month after starting [facility]  - Lipid panel; Future - TSH; Future - CBC; Future - Comprehensive metabolic panel; Future  3. Attention deficit hyperactivity disorder (ADHD), predominantly inattentive type - Continue with Adderall  - Lipid panel; Future - TSH; Future - CBC; Future - Comprehensive metabolic panel; Future  Shirline Frees, NP

## 2023-03-15 ENCOUNTER — Other Ambulatory Visit: Payer: Self-pay | Admitting: Adult Health

## 2023-03-15 DIAGNOSIS — E66811 Obesity, class 1: Secondary | ICD-10-CM

## 2023-03-17 ENCOUNTER — Telehealth: Payer: Self-pay

## 2023-03-17 ENCOUNTER — Other Ambulatory Visit (HOSPITAL_COMMUNITY): Payer: Self-pay

## 2023-03-17 NOTE — Telephone Encounter (Signed)
 Pharmacy Patient Advocate Encounter   Received notification from CoverMyMeds that prior authorization for Wegovy  0.5MG /0.5ML auto-injectors is required/requested.   Insurance verification completed.   The patient is insured through [facility] .   Per test claim: PA required; PA submitted to above mentioned insurance via CoverMyMeds Key/confirmation #/EOC [facility] Status is pending

## 2023-03-18 ENCOUNTER — Other Ambulatory Visit (HOSPITAL_COMMUNITY): Payer: Self-pay

## 2023-03-18 NOTE — Telephone Encounter (Signed)
 Pharmacy Patient Advocate Encounter  Received notification from [facility] that Prior Authorization for [facility] 0.5mg  pens has been APPROVED from 03/17/23 to 10/15/23   CMM Key #: [id number]

## 2023-03-22 NOTE — Telephone Encounter (Signed)
 Pt stated that he was advised that his insurance covers this medication. Pt stated he will try and pick it up today.

## 2023-04-22 ENCOUNTER — Other Ambulatory Visit: Payer: Self-pay | Admitting: Adult Health

## 2023-04-22 DIAGNOSIS — E66811 Obesity, class 1: Secondary | ICD-10-CM

## 2023-08-11 ENCOUNTER — Other Ambulatory Visit: Payer: Self-pay | Admitting: Adult Health

## 2023-08-11 DIAGNOSIS — F900 Attention-deficit hyperactivity disorder, predominantly inattentive type: Secondary | ICD-10-CM

## 2023-08-11 NOTE — Telephone Encounter (Unsigned)
 Copied from CRM [id number]. Topic: Clinical - Medication Refill >> Aug 11, 2023  4:22 PM Kevelyn M wrote: Medication: amphetamine -dextroamphetamine  (ADDERALL) 20 MG tablet  Has the patient contacted their pharmacy? Yes (Agent: If no, request that the patient contact the pharmacy for the refill. If patient does not wish to contact the pharmacy document the reason why and proceed with request.) (Agent: If yes, when and what did the pharmacy advise?)  This is the patient's preferred pharmacy:  [id number] [place], [place] - [address] [place] [address] Phone: [phone] Fax: [phone]  Is this the correct pharmacy for this prescription? Yes If no, delete pharmacy and type the correct one.   Has the prescription been filled recently? Yes  Is the patient out of the medication? Yes  Has the patient been seen for an appointment in the last year OR does the patient have an upcoming appointment? Yes  Can we respond through MyChart? Yes  Agent: Please be advised that Rx refills may take up to 3 business days. We ask that you follow-up with your pharmacy.

## 2023-08-12 ENCOUNTER — Other Ambulatory Visit: Payer: Self-pay | Admitting: Adult Health

## 2023-08-12 DIAGNOSIS — F900 Attention-deficit hyperactivity disorder, predominantly inattentive type: Secondary | ICD-10-CM

## 2023-08-12 MED ORDER — AMPHETAMINE-DEXTROAMPHETAMINE 20 MG PO TABS: 20.0000 mg | ORAL_TABLET | Freq: Two times a day (BID) | ORAL | 0 refills | Status: DC

## 2023-08-12 NOTE — Telephone Encounter (Signed)
 Okay for refill?

## 2023-12-20 ENCOUNTER — Ambulatory Visit: Payer: Self-pay

## 2023-12-20 ENCOUNTER — Ambulatory Visit (HOSPITAL_COMMUNITY): Admission: EM | Admit: 2023-12-20 | Discharge: 2023-12-20 | Disposition: A | Discharge status: Home / Self Care

## 2023-12-20 ENCOUNTER — Encounter (HOSPITAL_COMMUNITY): Payer: Self-pay

## 2023-12-20 DIAGNOSIS — K529 Noninfective gastroenteritis and colitis, unspecified: Secondary | ICD-10-CM

## 2023-12-20 DIAGNOSIS — R1010 Upper abdominal pain, unspecified: Secondary | ICD-10-CM

## 2023-12-20 MED ORDER — LIDOCAINE VISCOUS HCL 2 % MT SOLN
OROMUCOSAL | Status: AC
  Filled 2023-12-20: qty 15

## 2023-12-20 MED ORDER — ALUM & MAG HYDROXIDE-SIMETH 200-200-20 MG/5ML PO SUSP
30.0000 mL | Freq: Once | ORAL | Status: AC
  Administered 2023-12-20: 30 mL via ORAL

## 2023-12-20 MED ORDER — DIPHENHYDRAMINE HCL 12.5 MG/5ML PO ELIX: 25.0000 mg | ORAL_SOLUTION | Freq: Once | ORAL | Status: DC

## 2023-12-20 MED ORDER — ALUM & MAG HYDROXIDE-SIMETH 200-200-20 MG/5ML PO SUSP
ORAL | Status: AC
  Filled 2023-12-20: qty 30

## 2023-12-20 MED ORDER — LIDOCAINE VISCOUS HCL 2 % MT SOLN
15.0000 mL | Freq: Once | OROMUCOSAL | Status: AC
  Administered 2023-12-20: 15 mL via OROMUCOSAL

## 2023-12-20 NOTE — ED Provider Notes (Addendum)
 [facility]    CSN: [id number] Arrival date & time: 12/20/23  1522      History   Chief Complaint Chief Complaint  Patient presents with   Abdominal Pain    HPI Walter Haney is a 38 y.o. male.   Patient presents today due to intermittent upper abdominal pain that started on Sunday.  Patient states that his family was camping in a camper with some other family use over the weekend.  Patient denies suspect food or water intake.  Patient states that he has had some nausea but denies vomiting patient states that he has had 3 episodes of loose stools.  Patient denies any known sick contacts.  Patient states that he took Tums, Pepto-Bismol, and drink half a gallon of milk for symptoms.  Patient states that he felt better afterward half a gallon of milk.  Patient states that he had 1 episode of chills on Sunday night.  Patient denies fever or noticing blood or mucus in stool.  Patient denies excessive alcohol intake.   Abdominal Pain   Past Medical History:  Diagnosis Date   ADHD    Seasonal allergies     Patient Active Problem List   Diagnosis Date Noted   ADHD 05/18/2018    History reviewed. No pertinent surgical history.     Home Medications    Prior to Admission medications   Medication Sig Start Date End Date Taking? Authorizing Provider  amphetamine -dextroamphetamine  (ADDERALL) 20 MG tablet Take 1 tablet (20 mg total) by mouth 2 (two) times daily. 02/22/23   Nafziger, Darleene, NP  amphetamine -dextroamphetamine  (ADDERALL) 20 MG tablet Take 1 tablet (20 mg total) by mouth 2 (two) times daily. 02/22/23   Nafziger, Darleene, NP  amphetamine -dextroamphetamine  (ADDERALL) 20 MG tablet Take 1 tablet (20 mg total) by mouth 2 (two) times daily. 08/12/23   Nafziger, Darleene, NP  cetirizine (ZYRTEC) 10 MG tablet Take 10 mg by mouth daily.    [provider]  fluticasone  (FLONASE ) 50 MCG/ACT nasal spray SPRAY 2 SPRAYS INTO EACH NOSTRIL EVERY DAY 01/07/21   Nafziger,  Darleene, NP  Semaglutide -Weight Management (WEGOVY ) 0.5 MG/0.5ML SOAJ Inject 0.5 mg into the skin once a week. Patient not taking: Reported on 03/01/2023 02/22/23   Merna Darleene, NP    Family History Family History  Problem Relation Age of Onset   High blood pressure Father    High Cholesterol Father    Benign prostatic hyperplasia Father    Glaucoma Father     Social History Social History   Tobacco Use   Smoking status: Never   Smokeless tobacco: Current    Types: Chew  Vaping Use   Vaping status: Never Used  Substance Use Topics   Alcohol use: Yes    Alcohol/week: 3.0 standard drinks of alcohol    Types: 3 Cans of beer per week     Allergies   Patient has no known allergies.   Review of Systems Review of Systems  Gastrointestinal:  Positive for abdominal pain.     Physical Exam Triage Vital Signs ED Triage Vitals  Encounter Vitals Group     BP 12/20/23 1657 (!) 138/91     Girls Systolic BP Percentile --      Girls Diastolic BP Percentile --      Boys Systolic BP Percentile --      Boys Diastolic BP Percentile --      Pulse Rate 12/20/23 1657 71     Resp 12/20/23 1657 18  Temp 12/20/23 1657 98.2 F (36.8 C)     Temp Source 12/20/23 1657 Oral     SpO2 12/20/23 1657 98 %     Weight 12/20/23 1657 250 lb (113.4 kg)     Height 12/20/23 1657 6' (1.829 m)     Head Circumference --      Peak Flow --      Pain Score 12/20/23 1656 4     Pain Loc --      Pain Education --      Exclude from Growth Chart --    No data found.  Updated Vital Signs BP (!) 138/91 (BP Location: Right Arm)   Pulse 71   Temp 98.2 F (36.8 C) (Oral)   Resp 18   Ht 6' (1.829 m)   Wt 250 lb (113.4 kg)   SpO2 98%   BMI 33.91 kg/m   Visual Acuity Right Eye Distance:   Left Eye Distance:   Bilateral Distance:    Right Eye Near:   Left Eye Near:    Bilateral Near:     Physical Exam Vitals and nursing note reviewed.  Constitutional:      General: He is not in acute  distress.    Appearance: Normal appearance. He is well-developed. He is not ill-appearing, toxic-appearing or diaphoretic.  Eyes:     General: No scleral icterus. Cardiovascular:     Rate and Rhythm: Normal rate and regular rhythm.     Heart sounds: Normal heart sounds.  Pulmonary:     Effort: Pulmonary effort is normal. No respiratory distress.     Breath sounds: Normal breath sounds. No wheezing or rhonchi.  Abdominal:     General: Abdomen is flat. Bowel sounds are normal.     Palpations: Abdomen is soft.     Tenderness: There is abdominal tenderness in the right upper quadrant and epigastric area. There is no right CVA tenderness or left CVA tenderness.     Comments: Mild tenderness of right upper quadrant and epigastric region.  Skin:    General: Skin is warm.  Neurological:     Mental Status: He is alert and oriented to person, place, and time.  Psychiatric:        Mood and Affect: Mood normal.        Behavior: Behavior normal.      UC Treatments / Results  Labs (all labs ordered are listed, but only abnormal results are displayed) Labs Reviewed - No data to display  EKG   Radiology No results found.  Procedures Procedures (including critical care time)  Medications Ordered in UC Medications  alum & mag hydroxide-simeth (MAALOX/MYLANTA) 200-200-20 MG/5ML suspension 30 mL (30 mLs Oral Given 12/20/23 1740)  lidocaine (XYLOCAINE) 2 % viscous mouth solution 15 mL (15 mLs Mouth/Throat Given 12/20/23 1740)    Initial Impression / Assessment and Plan / UC Course  I have reviewed the triage vital signs and the nursing notes.  Pertinent labs & imaging results that were available during my care of the patient were reviewed by me and considered in my medical decision making (see chart for details).     Acute gastroenteritis-patient denies relief with in office GI cocktail, mild tenderness to palpation of right upper quadrant and epigastrium.  Differential diagnosis  includes pancreatitis, cholecystitis, and gastroenteritis.  Will treat symptoms as generic gastroenteritis.  Advised patient that if symptoms worsen to include nausea, vomiting, fever, increased abdominal pain that he needs to report to the ER immediately otherwise he  should follow-up with his PCP. Final Clinical Impressions(s) / UC Diagnoses   Final diagnoses:  Pain of upper abdomen  Acute gastroenteritis     Discharge Instructions      You have been diagnosed with a gastrointestinal issue today with symptoms of nausea, vomiting, and/or diarrhea.  It is best that you eat a bland diet which includes dry toast, applesauce, bananas, chicken broth, plain rice, or plain mashed potatoes eat these things with no salt-and-pepper, excessive butter, or other seasonings.  It is also important to drink plenty of fluids as you are losing a lot of electrolytes due to vomiting and having diarrhea.  Diluted Gatorade, water, Pedialyte, liquid IV, etc. are good choices for fluid intake.  You may also use popsicles and/or [place] ice.  It is recommended that you do not use anything to stop your diarrhea as it is your body's way of getting rid of the offending bug.  If your symptoms are not improving within 3 to 5 days, you are experiencing significant fatigue, or you are urinating less frequently you will need to follow-up with your PCP or report to the ER.     ED Prescriptions   None    PDMP not reviewed this encounter.   Andra Corean BROCKS, PA-C 12/20/23 1825    Andra Corean BROCKS, PA-C 12/20/23 1826

## 2023-12-20 NOTE — ED Triage Notes (Signed)
 Presenting with abdominal pain along the RUQ, states the pain does move around. Had 2 episode of loose stools today and yesterday. States felt like indigestion Sunday.   States today the pain is significantly worse, sharp shooting pain, and sweats. Has been nauseous but no emesis. No one else with similar symptoms or sick.   Did come home from camping yesterday.

## 2023-12-20 NOTE — Discharge Instructions (Signed)

## 2023-12-20 NOTE — Telephone Encounter (Signed)
 FYI Only or Action Required?: FYI only for provider.  Patient was last seen in primary care on 03/01/2023 by Merna Huxley, NP.  Called Nurse Triage reporting Abdominal Pain.  Symptoms began several days ago.  Interventions attempted: OTC medications: TUMS; Pepto.  Symptoms are: gradually worsening.  Triage Disposition: See HCP Within 4 Hours (Or PCP Triage)  Patient/caregiver understands and will follow disposition?: Yes  Copied from CRM #[id number]. Topic: Clinical - Red Word Triage >> Dec 20, 2023  2:20 PM Rea ORN wrote: Red Word that prompted transfer to Nurse Triage: Bad stomach pains. Hurts to touch stomach for the past 3 days. Reason for Disposition  [1] MILD-MODERATE pain AND [2] constant AND [3] present > 2 hours  Answer Assessment - Initial Assessment Questions Pt states for the last three days he's had abdominal pain mostly to RUQ. States currently it feels like its burning right below his ribs. Admits to nausea and diarrhea. Denies hx gallstones or other abd issues. Agreeable to UC at this time for sxs evaluation and treatment.   1. LOCATION: Where does it hurt?      Upper right quadrant 2. RADIATION: Does the pain shoot anywhere else? (e.g., chest, back)     denies 3. ONSET: When did the pain begin? (Minutes, hours or days ago)      3 days ago 4. SUDDEN: Gradual or sudden onset?     gradual 5. PATTERN Does the pain come and go, or is it constant?     intermittent 6. SEVERITY: How bad is the pain?  (e.g., Scale 1-10; mild, moderate, or severe)     6-7/10 7. RECURRENT SYMPTOM: Have you ever had this type of stomach pain before? If Yes, ask: When was the last time? and What happened that time?      denies 8. CAUSE: What do you think is causing the stomach pain? (e.g., gallstones, recent abdominal surgery)     unsure 9. RELIEVING/AGGRAVATING FACTORS: What makes it better or worse? (e.g., antacids, bending or twisting motion, bowel movement)      denies 10. OTHER SYMPTOMS: Do you have any other symptoms? (e.g., back pain, diarrhea, fever, urination pain, vomiting)       Nausea and diarrhea this AM.  Protocols used: Abdominal Pain - Male-A-AH

## 2024-01-09 ENCOUNTER — Ambulatory Visit: Payer: Self-pay

## 2024-01-09 NOTE — Telephone Encounter (Signed)
  FYI Only or Action Required?: FYI only for provider: appointment scheduled on 11/6.  Patient was last seen in primary care on 03/01/2023 by Merna Huxley, NP.  Called Nurse Triage reporting Cough.  Symptoms began a week ago.  Interventions attempted: OTC medications: alka setzer.  Symptoms are: gradually worsening.  Triage Disposition: See Physician Within 24 Hours- pt declined, prefers to see PCP only. Appt scheduled for 11/6  Patient/caregiver understands and will follow disposition?: Yes Copied from CRM #[id number]. Topic: Clinical - Red Word Triage >> Jan 09, 2024  3:52 PM Rea ORN wrote: Red Word that prompted transfer to Nurse Triage:  sore throat, congestion, ear pain, productive cough Reason for Disposition  Earache  Answer Assessment - Initial Assessment Questions 1. LOCATION: Where does it hurt?      Around nose, on side of neck, and both ears  2. ONSET: When did the sinus pain start?  (e.g., hours, days)      Started 1 week ago  3. SEVERITY: How bad is the pain?   (Scale 0-10; or none, mild, moderate or severe)     Moderate  4. RECURRENT SYMPTOM: Have you ever had sinus problems before? If Yes, ask: When was the last time? and What happened that time?      Yes, gets sinus issues same time every year  5. NASAL CONGESTION: Is the nose blocked? If Yes, ask: Can you open it or must you breathe through your mouth?     No nasal congestion during the day, but gets congested in the evening  6. NASAL DISCHARGE: Do you have discharge from your nose? If so ask, What color?     *No Answer* 7. FEVER: Do you have a fever? If Yes, ask: What is it, how was it measured, and when did it start?      Unsure of actual temp, but has felt warm and had chills.   8. OTHER SYMPTOMS: Do you have any other symptoms? (e.g., sore throat, cough, earache, difficulty breathing)     Sore throat, cough, throat draining  Protocols used: Sinus Pain or Congestion-A-AH

## 2024-01-11 ENCOUNTER — Ambulatory Visit: Admitting: Adult Health

## 2024-01-11 DIAGNOSIS — J209 Acute bronchitis, unspecified: Secondary | ICD-10-CM | POA: Diagnosis not present

## 2024-01-11 DIAGNOSIS — H6693 Otitis media, unspecified, bilateral: Secondary | ICD-10-CM | POA: Diagnosis not present

## 2024-02-21 ENCOUNTER — Encounter: Payer: Self-pay | Admitting: Adult Health

## 2024-02-21 ENCOUNTER — Ambulatory Visit: Payer: Self-pay

## 2024-02-21 ENCOUNTER — Ambulatory Visit: Admitting: Adult Health

## 2024-02-21 VITALS — BP 110/80 | HR 85 | Temp 98.1°F | Ht 72.0 in | Wt 269.0 lb

## 2024-02-21 DIAGNOSIS — Z6836 Body mass index (BMI) 36.0-36.9, adult: Secondary | ICD-10-CM

## 2024-02-21 DIAGNOSIS — F900 Attention-deficit hyperactivity disorder, predominantly inattentive type: Secondary | ICD-10-CM

## 2024-02-21 DIAGNOSIS — J0100 Acute maxillary sinusitis, unspecified: Secondary | ICD-10-CM | POA: Diagnosis not present

## 2024-02-21 LAB — POCT RAPID STREP A (OFFICE): Rapid Strep A Screen: NEGATIVE

## 2024-02-21 MED ORDER — WEGOVY 0.25 MG/0.5ML ~~LOC~~ SOAJ: 0.2500 mg | SUBCUTANEOUS | 1 refills | Status: DC

## 2024-02-21 MED ORDER — AMPHETAMINE-DEXTROAMPHETAMINE 20 MG PO TABS: 20.0000 mg | ORAL_TABLET | Freq: Two times a day (BID) | ORAL | 0 refills | Status: AC

## 2024-02-21 MED ORDER — DOXYCYCLINE HYCLATE 100 MG PO CAPS: 100.0000 mg | ORAL_CAPSULE | Freq: Two times a day (BID) | ORAL | 0 refills | Status: DC

## 2024-02-21 NOTE — Telephone Encounter (Signed)
 FYI Only or Action Required?: FYI only for provider: appointment scheduled on 02/21/24.  Patient was last seen in primary care on 03/01/2023 by Merna Huxley, NP.  Called Nurse Triage reporting Sinusitis and Cough.  Symptoms began several days ago.  Interventions attempted: OTC medications: Alka Seltzer cold and sinus, Tylenol .  Symptoms are: unchanged.  Triage Disposition: See PCP When Office is Open (Within 3 Days)  Patient/caregiver understands and will follow disposition?: Yes                                  1. LOCATION: Where does it hurt?      Forehead and around nose, lymph nodes in neck 2. ONSET: When did the sinus pain start?  (e.g., hours, days)      Thursday, worsened Sunday 3. SEVERITY: How bad is the pain?   (Scale 0-10; or none, mild, moderate or severe)     Rates sinus pain 6-7 5. NASAL CONGESTION: Is the nose blocked? If Yes, ask: Can you open it or must you breathe through your mouth?     Denies difficulty breathing 6. NASAL DISCHARGE: Do you have discharge from your nose? If so ask, What color?     Dark green or yellow, reports minimal amounts of blood mixed in with nasal mucous and mucous that is coughed up 7. FEVER: Do you have a fever? If Yes, ask: What is it, how was it measured, and when did it start?      Reports chills, no fever as of last night, feels warm to the touch 8. OTHER SYMPTOMS: Do you have any other symptoms? (e.g., sore throat, cough, earache, difficulty breathing)     Sinuses are painful to the touch, muffled hearing, productive cough that worsens at night- yellow/green mucous, body aches Denies visible sinus swelling, denies redness along sinuses, denies chest pain, denies difficulty breathing   Per patient, he was evaluated in UC a few weeks ago and treated for strep throat. Patient stated he completed amoxicillin  and a steroid. Patient reports new sinus symptoms started last week. This RN  advised in-person evaluation within 3 days. This RN scheduled same day appointment with PCP.   Copied from CRM #[id number]. Topic: Clinical - Red Word Triage >> Feb 21, 2024  9:20 AM Willma R wrote: [facility] that prompted transfer to Nurse Triage: Patient states he is not feeling well, had strep 2 weeks ago and was feeling better but it feels like its all back, Has chest congestion, cough, fever, discolored mucous, sinus pressure, pain in eyes when he looks left or right, and body aches.  Reason for Disposition  Lots of coughing  Protocols used: Sinus Pain or Congestion-A-AH

## 2024-02-21 NOTE — Progress Notes (Signed)
 Subjective:    Patient ID: Walter Haney, male    DOB: Dec 26, 1985, 38 y.o.   MRN: [id number]  HPI  Discussed the use of AI scribe software for clinical note transcription with the patient, who gave verbal consent to proceed.  History of Present Illness   Walter Haney is a 38 year old male who presents with persistent sinus and ear symptoms following a recent ear infection and strep throat.  He has had recurrent sinus and ear symptoms for 3 days after an ear infection and strep throat that started 3 weeks ago and were treated with prednisone , amoxicillin , and cough syrup, with partial improvement after completing antibiotics. Over the past week his sinus congestion has worsened, with thick mucus of varying colors, muffled hearing, and a plugged sensation in his ears. He has  chills but has not had a documented fever. His throat appears red, noted by his wife, and his 7-year-old child recently finished treatment for strep throat but has has no throat pain .    He would also like to get restarted on Adderall for ADHD. He has been off the medication for about a year and was tried to manage his symptoms on his own. he has trouble concentrating at work and at home and would like to be placed back on it.    Additionally, he would like to go back on Wegovy . He was last prescribed this about a year ago due to insurance issues but now it is covered by his insurance and he would like to go back on it. His weight has been climbing back up.    Wt Readings from Last 3 Encounters: 02/21/24 269 lb (122 kg) 12/20/23 250 lb (113.4 kg) 03/01/23 258 lb (117 kg)        Review of Systems See HPI   Past Medical History:  Diagnosis Date   ADHD    Seasonal allergies     Social History   Socioeconomic History   Marital status: Single    Spouse name: Not on file   Number of children: Not on file   Years of education: Not on file   Highest education level: Not on file  Occupational  History   Not on file  Tobacco Use   Smoking status: Never   Smokeless tobacco: Current    Types: Chew  Vaping Use   Vaping status: Never Used  Substance and Sexual Activity   Alcohol use: Yes    Alcohol/week: 3.0 standard drinks of alcohol    Types: 3 Cans of beer per week   Drug use: Not on file   Sexual activity: Yes    Partners: Female  Other Topics Concern   Not on file  Social History Narrative   Not on file   Social Drivers of Health   Tobacco Use: High Risk (02/21/2024)   Patient History    Smoking Tobacco Use: Never    Smokeless Tobacco Use: Current    Passive Exposure: Not on file  Financial Resource Strain: Not on file  Food Insecurity: Not on file  Transportation Needs: Not on file  Physical Activity: Not on file  Stress: Not on file (01/13/2023)  Social Connections: Unknown (07/10/2021)   Received [facility]   Social Network    Social Network: Not on file  Intimate Partner Violence: Unknown (06/08/2021)   Received from [facility]   HITS    Physically Hurt: Not on file    Insult or Talk  Down To: Not on file    Threaten Physical Harm: Not on file    Scream or Curse: Not on file  Depression (PHQ2-9): Low Risk (03/16/2022)   Depression (PHQ2-9)    PHQ-2 Score: 0  Alcohol Screen: Not on file  Housing: Not on file  Utilities: Not on file  Health Literacy: Not on file    History reviewed. No pertinent surgical history.  Family History  Problem Relation Age of Onset   High blood pressure Father    High Cholesterol Father    Benign prostatic hyperplasia Father    Glaucoma Father     Allergies[1]  Medications Ordered Prior to Encounter[2]  BP 110/80   Pulse 85   Temp 98.1 F (36.7 C) (Oral)   Ht 6' (1.829 m)   Wt 269 lb (122 kg)   SpO2 96%   BMI 36.48 kg/m       Objective:   Physical Exam Vitals and nursing note reviewed.  Constitutional:      Appearance: Normal appearance. He is obese. He is ill-appearing.  HENT:     Right  Ear: Tympanic membrane, ear canal and external ear normal. There is no impacted cerumen.     Left Ear: Tympanic membrane, ear canal and external ear normal. There is no impacted cerumen.     Nose: Congestion and rhinorrhea present. Rhinorrhea is purulent.     Right Turbinates: Enlarged and swollen.     Left Turbinates: Enlarged and swollen.     Right Sinus: Maxillary sinus tenderness and frontal sinus tenderness present.     Left Sinus: Maxillary sinus tenderness and frontal sinus tenderness present.     Mouth/Throat:     Pharynx: Posterior oropharyngeal erythema present.     Tonsils: No tonsillar exudate or tonsillar abscesses. 1+ on the right. 1+ on the left.  Cardiovascular:     Rate and Rhythm: Normal rate and regular rhythm.     Pulses: Normal pulses.     Heart sounds: Normal heart sounds.  Pulmonary:     Effort: Pulmonary effort is normal.     Breath sounds: Normal breath sounds.  Musculoskeletal:        General: Normal range of motion.  Lymphadenopathy:     Head:     Right side of head: No submental, submandibular or tonsillar adenopathy.     Left side of head: No submental, submandibular or tonsillar adenopathy.  Skin:    General: Skin is warm and dry.  Neurological:     General: No focal deficit present.     Mental Status: He is alert and oriented to person, place, and time.  Psychiatric:        Mood and Affect: Mood normal.        Behavior: Behavior normal.        Thought Content: Thought content normal.        Judgment: Judgment normal.        Assessment & Plan:   1. Acute non-recurrent maxillary sinusitis (Primary)  - POC Rapid Strep A- negative.  - Sinusitis likely did not completely resolve with amoxicillin . Will place on Doxycycline   - doxycycline  (VIBRAMYCIN ) 100 MG capsule; Take 1 capsule (100 mg total) by mouth 2 (two) times daily.  Dispense: 20 capsule; Refill: 0  2. Attention deficit hyperactivity disorder (ADHD), predominantly inattentive type -  Restart on Adderall. Follow up in 6 months  - amphetamine -dextroamphetamine  (ADDERALL) 20 MG tablet; Take 1 tablet (20 mg total) by mouth 2 (two) times  daily.  Dispense: 60 tablet; Refill: 0 - amphetamine -dextroamphetamine  (ADDERALL) 20 MG tablet; Take 1 tablet (20 mg total) by mouth 2 (two) times daily.  Dispense: 60 tablet; Refill: 0 - amphetamine -dextroamphetamine  (ADDERALL) 20 MG tablet; Take 1 tablet (20 mg total) by mouth 2 (two) times daily.  Dispense: 60 tablet; Refill: 0  3. BMI 36.0-36.9,adult - Will place back on Wegovy  - for the first month take 0.25 mg weekly and increase to 0.5 mg weekly for the second month and then follow up.  - semaglutide -weight management (WEGOVY ) 0.25 MG/0.5ML SOAJ SQ injection; Inject 0.25 mg into the skin once a week.  Dispense: 2 mL; Refill: 1  Darleene Shape, NP     [1] No Known Allergies [2]  Current Outpatient Medications on File Prior to Visit  Medication Sig Dispense Refill   amphetamine -dextroamphetamine  (ADDERALL) 20 MG tablet Take 1 tablet (20 mg total) by mouth 2 (two) times daily. 60 tablet 0   amphetamine -dextroamphetamine  (ADDERALL) 20 MG tablet Take 1 tablet (20 mg total) by mouth 2 (two) times daily. 60 tablet 0   amphetamine -dextroamphetamine  (ADDERALL) 20 MG tablet Take 1 tablet (20 mg total) by mouth 2 (two) times daily. 60 tablet 0   cetirizine (ZYRTEC) 10 MG tablet Take 10 mg by mouth daily.     chlorpheniramine-HYDROcodone (TUSSIONEX) 10-8 MG/5ML SMARTSIG:5 Milliliter(s) By Mouth Every 12 Hours PRN     fluticasone  (FLONASE ) 50 MCG/ACT nasal spray SPRAY 2 SPRAYS INTO EACH NOSTRIL EVERY DAY 48 mL 3   Semaglutide -Weight Management (WEGOVY ) 0.5 MG/0.5ML SOAJ Inject 0.5 mg into the skin once a week. 2 mL 0   No current facility-administered medications on file prior to visit.

## 2024-02-22 ENCOUNTER — Other Ambulatory Visit (HOSPITAL_COMMUNITY): Payer: Self-pay

## 2024-02-24 ENCOUNTER — Other Ambulatory Visit (HOSPITAL_COMMUNITY): Payer: Self-pay

## 2024-02-24 ENCOUNTER — Telehealth: Payer: Self-pay

## 2024-02-24 NOTE — Telephone Encounter (Signed)
 Pharmacy Patient Advocate Encounter   Received notification from Onbase that prior authorization for Wegovy  0.25 is required/requested.   Unable to verify patient insurance coverage. From CMM:

## 2024-02-28 NOTE — Telephone Encounter (Signed)
 Left detailed message informing pt to call us  back to verify insurance.

## 2024-02-28 NOTE — Telephone Encounter (Signed)
 Pt called back and I was able to update insurance and received eligible status for coverage. I also instructed pt to upload a copy of his current insurance card to [facility] in case you need to verify it.

## 2024-03-06 ENCOUNTER — Other Ambulatory Visit (HOSPITAL_COMMUNITY): Payer: Self-pay

## 2024-03-06 NOTE — Telephone Encounter (Signed)
 Noted

## 2024-03-07 ENCOUNTER — Telehealth: Payer: Self-pay

## 2024-03-07 ENCOUNTER — Encounter: Payer: Self-pay | Admitting: Adult Health

## 2024-03-07 ENCOUNTER — Other Ambulatory Visit (HOSPITAL_COMMUNITY): Payer: Self-pay

## 2024-03-07 ENCOUNTER — Ambulatory Visit (INDEPENDENT_AMBULATORY_CARE_PROVIDER_SITE_OTHER): Admitting: Adult Health

## 2024-03-07 ENCOUNTER — Ambulatory Visit: Payer: Self-pay | Admitting: Adult Health

## 2024-03-07 VITALS — BP 110/80 | HR 70 | Temp 98.0°F | Ht 72.0 in | Wt 261.0 lb

## 2024-03-07 DIAGNOSIS — Z6836 Body mass index (BMI) 36.0-36.9, adult: Secondary | ICD-10-CM | POA: Diagnosis not present

## 2024-03-07 DIAGNOSIS — E782 Mixed hyperlipidemia: Secondary | ICD-10-CM

## 2024-03-07 DIAGNOSIS — E66812 Obesity, class 2: Secondary | ICD-10-CM

## 2024-03-07 DIAGNOSIS — F900 Attention-deficit hyperactivity disorder, predominantly inattentive type: Secondary | ICD-10-CM | POA: Diagnosis not present

## 2024-03-07 DIAGNOSIS — Z0000 Encounter for general adult medical examination without abnormal findings: Secondary | ICD-10-CM

## 2024-03-07 LAB — COMPREHENSIVE METABOLIC PANEL WITH GFR
ALT: 32 U/L (ref 3–53)
AST: 24 U/L (ref 5–37)
Albumin: 4.5 g/dL (ref 3.5–5.2)
Alkaline Phosphatase: 37 U/L — ABNORMAL LOW (ref 39–117)
BUN: 13 mg/dL (ref 6–23)
CO2: 30 meq/L (ref 19–32)
Calcium: 9.2 mg/dL (ref 8.4–10.5)
Chloride: 102 meq/L (ref 96–112)
Creatinine, Ser: 0.89 mg/dL (ref 0.40–1.50)
GFR: 108.85 mL/min
Glucose, Bld: 99 mg/dL (ref 70–99)
Potassium: 4 meq/L (ref 3.5–5.1)
Sodium: 139 meq/L (ref 135–145)
Total Bilirubin: 0.6 mg/dL (ref 0.2–1.2)
Total Protein: 7.2 g/dL (ref 6.0–8.3)

## 2024-03-07 LAB — LIPID PANEL
Cholesterol: 166 mg/dL (ref 28–200)
HDL: 48.3 mg/dL
LDL Cholesterol: 91 mg/dL (ref 10–99)
NonHDL: 117.49
Total CHOL/HDL Ratio: 3
Triglycerides: 130 mg/dL (ref 10.0–149.0)
VLDL: 26 mg/dL (ref 0.0–40.0)

## 2024-03-07 LAB — CBC
HCT: 43 % (ref 39.0–52.0)
Hemoglobin: 15 g/dL (ref 13.0–17.0)
MCHC: 34.7 g/dL (ref 30.0–36.0)
MCV: 94.4 fl (ref 78.0–100.0)
Platelets: 232 K/uL (ref 150.0–400.0)
RBC: 4.56 Mil/uL (ref 4.22–5.81)
RDW: 12.7 % (ref 11.5–15.5)
WBC: 4.4 K/uL (ref 4.0–10.5)

## 2024-03-07 MED ORDER — WEGOVY 0.25 MG/0.5ML ~~LOC~~ SOAJ: 0.2500 mg | SUBCUTANEOUS | 1 refills | Status: AC

## 2024-03-07 NOTE — Progress Notes (Signed)
 "  Subjective:    Patient ID: Walter Haney, male    DOB: [birth year], 38 y.o.   MRN: [id number]  HPI Patient presents for yearly preventative medicine examination. He is a pleasant 38 year old male who  has a past medical history of ADHD and Seasonal allergies.  ADHD -prescribed Adderall 20 mg twice daily.  He feels as this dose controls his symptoms well.  Denies side effects such as insomnia or anorexia  Obesity - he has not restarted Wegovy  yet.   Wt Readings from Last 3 Encounters:  03/07/24 261 lb (118.4 kg)  02/21/24 269 lb (122 kg)  12/20/23 250 lb (113.4 kg)   Hyperlipidemia - history of mildly elevated LDL in the past. He is not currently on statin therapy Lab Results  Component Value Date   CHOL 156 03/01/2023   HDL 48.50 03/01/2023   LDLCALC 83 03/01/2023   LDLDIRECT 102.0 08/09/2017   TRIG 125.0 03/01/2023   CHOLHDL 3 03/01/2023   All immunizations and health maintenance protocols were reviewed with the patient and needed orders were placed.  Appropriate screening laboratory values were ordered for the patient including screening of hyperlipidemia, renal function and hepatic function.  Medication reconciliation,  past medical history, social history, problem list and allergies were reviewed in detail with the patient  Goals were established with regard to weight loss, exercise, and  diet in compliance with medications  Review of Systems  Constitutional: Negative.   HENT: Negative.    Eyes: Negative.   Respiratory: Negative.    Cardiovascular: Negative.   Gastrointestinal: Negative.   Endocrine: Negative.   Genitourinary: Negative.   Musculoskeletal: Negative.   Skin: Negative.   Allergic/Immunologic: Negative.   Neurological: Negative.   Hematological: Negative.   Psychiatric/Behavioral: Negative.    All other systems reviewed and are negative.  Past Medical History:  Diagnosis Date   ADHD    Seasonal allergies     Social History    Socioeconomic History   Marital status: Single    Spouse name: Not on file   Number of children: Not on file   Years of education: Not on file   Highest education level: Not on file  Occupational History   Not on file  Tobacco Use   Smoking status: Never   Smokeless tobacco: Former    Types: Chew    Quit date: 08/2023   Tobacco comments:    Nicotine patches    Vaping Use   Vaping status: Never Used  Substance and Sexual Activity   Alcohol use: Yes    Alcohol/week: 3.0 standard drinks of alcohol    Types: 3 Cans of beer per week   Drug use: Never   Sexual activity: Yes    Partners: Female  Other Topics Concern   Not on file  Social History Narrative   Not on file   Social Drivers of Health   Tobacco Use: Medium Risk (03/07/2024)   Patient History    Smoking Tobacco Use: Never    Smokeless Tobacco Use: Former    Passive Exposure: Not on Actuary Strain: Not on file  Food Insecurity: Not on file  Transportation Needs: Not on file  Physical Activity: Not on file  Stress: Not on file (01/13/2023)  Social Connections: Unknown (07/10/2021)   [facility]   Social Network    Social Network: Not on file  Intimate Partner Violence: Unknown (06/08/2021)   Received [facility]   HITS  Physically Hurt: Not on file    Insult or Talk Down To: Not on file    Threaten Physical Harm: Not on file    Scream or Curse: Not on file  Depression (PHQ2-9): Low Risk (03/07/2024)   Depression (PHQ2-9)    PHQ-2 Score: 0  Alcohol Screen: Not on file  Housing: Not on file  Utilities: Not on file  Health Literacy: Not on file    History reviewed. No pertinent surgical history.  Family History  Problem Relation Age of Onset   High blood pressure Father    High Cholesterol Father    Benign prostatic hyperplasia Father    Glaucoma Father     Allergies[1]  Medications Ordered Prior to Encounter[2]  BP 110/80   Pulse 70   Temp 98 F (36.7  C) (Oral)   Ht 6' (1.829 m)   Wt 261 lb (118.4 kg)   SpO2 97%   BMI 35.40 kg/m       Objective:   Physical Exam Vitals and nursing note reviewed.  Constitutional:      General: He is not in acute distress.    Appearance: Normal appearance. He is not ill-appearing.  HENT:     Head: Normocephalic and atraumatic.     Right Ear: Tympanic membrane, ear canal and external ear normal. There is no impacted cerumen.     Left Ear: Tympanic membrane, ear canal and external ear normal. There is no impacted cerumen.     Nose: Nose normal. No congestion or rhinorrhea.     Mouth/Throat:     Mouth: Mucous membranes are moist.     Pharynx: Oropharynx is clear.  Eyes:     Extraocular Movements: Extraocular movements intact.     Conjunctiva/sclera: Conjunctivae normal.     Pupils: Pupils are equal, round, and reactive to light.  Neck:     Vascular: No carotid bruit.  Cardiovascular:     Rate and Rhythm: Normal rate and regular rhythm.     Pulses: Normal pulses.     Heart sounds: No murmur heard.    No friction rub. No gallop.  Pulmonary:     Effort: Pulmonary effort is normal.     Breath sounds: Normal breath sounds.  Abdominal:     General: Abdomen is flat. Bowel sounds are normal. There is no distension.     Palpations: Abdomen is soft. There is no mass.     Tenderness: There is no abdominal tenderness. There is no guarding or rebound.     Hernia: No hernia is present.  Musculoskeletal:        General: Normal range of motion.     Cervical back: Normal range of motion and neck supple.  Lymphadenopathy:     Cervical: No cervical adenopathy.  Skin:    General: Skin is warm and dry.     Capillary Refill: Capillary refill takes less than 2 seconds.  Neurological:     General: No focal deficit present.     Mental Status: He is alert and oriented to person, place, and time.  Psychiatric:        Mood and Affect: Mood normal.        Behavior: Behavior normal.        Thought Content:  Thought content normal.        Judgment: Judgment normal.        Assessment & Plan:  1. Routine general medical examination at a health care facility (Primary) Today patient counseled on age  appropriate routine health concerns for screening and prevention, each reviewed and up to date or declined. Immunizations reviewed and up to date or declined. Labs ordered and reviewed. Risk factors for depression reviewed and negative. Hearing function and visual acuity are intact. ADLs screened and addressed as needed. Functional ability and level of safety reviewed and appropriate. Education, counseling and referrals performed based on assessed risks today. Patient provided with a copy of personalized plan for preventive services. - Follow up in one year or sooner if needed  2. Attention deficit hyperactivity disorder (ADHD), predominantly inattentive type - Continue with Adderall 20 mg BID  3. Class 2 obesity - He will pick up his Wegovy  and follow up as directed - Lipid panel; Future - CBC; Future - Comprehensive metabolic panel with GFR; Future  4. BMI 36.0-36.9,adult  - Lipid panel; Future - CBC; Future - Comprehensive metabolic panel with GFR; Future - semaglutide -weight management (WEGOVY ) 0.25 MG/0.5ML SOAJ SQ injection; Inject 0.25 mg into the skin once a week.  Dispense: 2 mL; Refill: 1  5. Mixed hyperlipidemia - Consider statin  - Lipid panel; Future - CBC; Future - Comprehensive metabolic panel with GFR; Future  Darleene Shape, NP     [1] No Known Allergies [2]  Current Outpatient Medications on File Prior to Visit  Medication Sig Dispense Refill   amphetamine -dextroamphetamine  (ADDERALL) 20 MG tablet Take 1 tablet (20 mg total) by mouth 2 (two) times daily. 60 tablet 0   amphetamine -dextroamphetamine  (ADDERALL) 20 MG tablet Take 1 tablet (20 mg total) by mouth 2 (two) times daily. 60 tablet 0   amphetamine -dextroamphetamine  (ADDERALL) 20 MG tablet Take 1 tablet (20 mg  total) by mouth 2 (two) times daily. 60 tablet 0   cetirizine (ZYRTEC) 10 MG tablet Take 10 mg by mouth daily.     chlorpheniramine-HYDROcodone (TUSSIONEX) 10-8 MG/5ML SMARTSIG:5 Milliliter(s) By Mouth Every 12 Hours PRN     fluticasone  (FLONASE ) 50 MCG/ACT nasal spray SPRAY 2 SPRAYS INTO EACH NOSTRIL EVERY DAY 48 mL 3   doxycycline  (VIBRAMYCIN ) 100 MG capsule Take 1 capsule (100 mg total) by mouth 2 (two) times daily. 20 capsule 0   No current facility-administered medications on file prior to visit.   "

## 2024-03-07 NOTE — Telephone Encounter (Signed)
 Pharmacy Patient Advocate Encounter   Received notification from Pt Calls Messages that prior authorization for Wegovy  0.25MG /0.5ML auto-injectors is required/requested.   Insurance verification completed.   The patient is insured through [employer].   Per test claim: PA required; PA submitted to above mentioned insurance via Latent Key/confirmation #/EOC [id number] Status is pending

## 2024-03-07 NOTE — Telephone Encounter (Signed)
 PA request has been Approved. New Encounter has been or will be created for follow up. For additional info see Pharmacy Prior Auth telephone encounter from 03/07/24.

## 2024-03-07 NOTE — Telephone Encounter (Signed)
 Pharmacy Patient Advocate Encounter  Received notification from [employer] that Prior Authorization for Wegovy  0.25MG /0.5ML auto-injectors  has been APPROVED from 02/06/24 to 10/03/24. Ran test claim, Copay is $25. This test claim was processed through [facility]- copay amounts may vary at other pharmacies due to pharmacy/plan contracts, or as the patient moves through the different stages of their insurance plan.   PA #/Case ID/Reference #: [id number]

## 2024-03-09 NOTE — Telephone Encounter (Signed)
 Called pt to advise of message below but no answer.

## 2024-03-09 NOTE — Telephone Encounter (Signed)
 Patient notified of update  and verbalized understanding.

## 2024-03-15 ENCOUNTER — Telehealth: Payer: Self-pay | Admitting: *Deleted

## 2024-03-15 NOTE — Telephone Encounter (Signed)
 Copied from CRM #[id number]. Topic: Clinical - Prescription Issue >> Mar 15, 2024 12:21 PM China J wrote: Reason for CRM: Patient is needing Marjorie to give him a call in regards to his medication. In order for him to take his medication on the plane with him, he is needing a letter explaining why along with other information. Patient's flight leaves tomorrow!  Please call [phone].

## 2024-03-16 NOTE — Telephone Encounter (Signed)
"  Please advise on letter  "

## 2024-03-16 NOTE — Telephone Encounter (Signed)
 Called pt twice no answer. Left vm for return call.

## 2024-03-16 NOTE — Telephone Encounter (Signed)
 Called pt again and he stated that it was ok now. Pt claimed he read that the medication was extremely illegal in [place] so he just left it at home.

## 2024-04-24 ENCOUNTER — Ambulatory Visit: Admitting: Adult Health
